# Patient Record
Sex: Female | Born: 1965 | Race: Black or African American | Hispanic: No | Smoking: Former smoker
Health system: Southern US, Community
[De-identification: ages and names within clinical notes are randomized; demographics above are authoritative.]

## PROBLEM LIST (undated history)

## (undated) DIAGNOSIS — I10 Essential (primary) hypertension: Secondary | ICD-10-CM

## (undated) DIAGNOSIS — M329 Systemic lupus erythematosus, unspecified: Secondary | ICD-10-CM

## (undated) DIAGNOSIS — M1990 Unspecified osteoarthritis, unspecified site: Secondary | ICD-10-CM

## (undated) HISTORY — DX: Essential (primary) hypertension: I10

---

## 2009-01-21 ENCOUNTER — Ambulatory Visit: Payer: Self-pay | Admitting: Family Medicine

## 2009-03-11 ENCOUNTER — Ambulatory Visit: Payer: Self-pay | Admitting: Family Medicine

## 2010-08-31 ENCOUNTER — Ambulatory Visit: Payer: Self-pay | Admitting: Family Medicine

## 2011-01-06 ENCOUNTER — Ambulatory Visit: Payer: Self-pay | Admitting: Family Medicine

## 2011-01-16 ENCOUNTER — Ambulatory Visit: Payer: Self-pay | Admitting: Family Medicine

## 2011-02-09 ENCOUNTER — Ambulatory Visit: Payer: Self-pay | Admitting: Family Medicine

## 2011-03-09 ENCOUNTER — Ambulatory Visit: Payer: Self-pay | Admitting: Gastroenterology

## 2011-03-14 LAB — PATHOLOGY REPORT

## 2011-12-21 ENCOUNTER — Emergency Department: Payer: Self-pay | Admitting: Unknown Physician Specialty

## 2011-12-21 LAB — URINALYSIS, COMPLETE
Bacteria: NONE SEEN
Bilirubin,UR: NEGATIVE
Glucose,UR: NEGATIVE mg/dL (ref 0–75)
Ketone: NEGATIVE
Leukocyte Esterase: NEGATIVE
Nitrite: NEGATIVE
Ph: 6 (ref 4.5–8.0)
RBC,UR: 2 /HPF (ref 0–5)
Squamous Epithelial: 2
WBC UR: 2 /HPF (ref 0–5)

## 2011-12-21 LAB — PREGNANCY, URINE: Pregnancy Test, Urine: NEGATIVE m[IU]/mL

## 2012-02-05 IMAGING — US TRANSABDOMINAL ULTRASOUND OF PELVIS
1 series · 17 of 25 positions shown · non-contrast
Comparison: none

REASON FOR EXAM: ovarian cyst
COMMENTS:

[Series 1: transabdominal ultrasound of pelvis · 200 acquisitions, 17 frames shown]
[im 1/200]
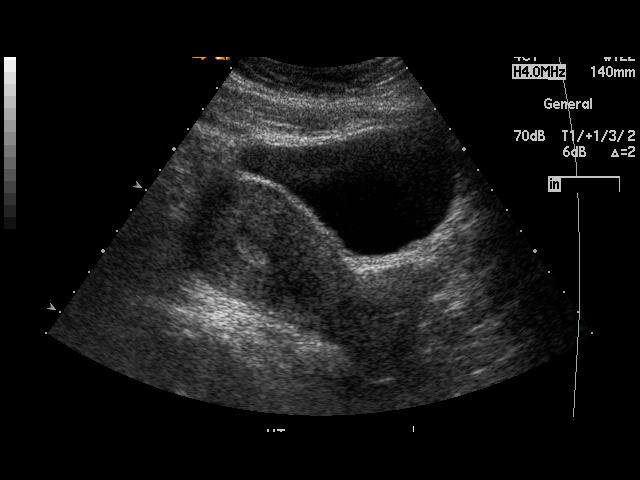
[im 17/200]
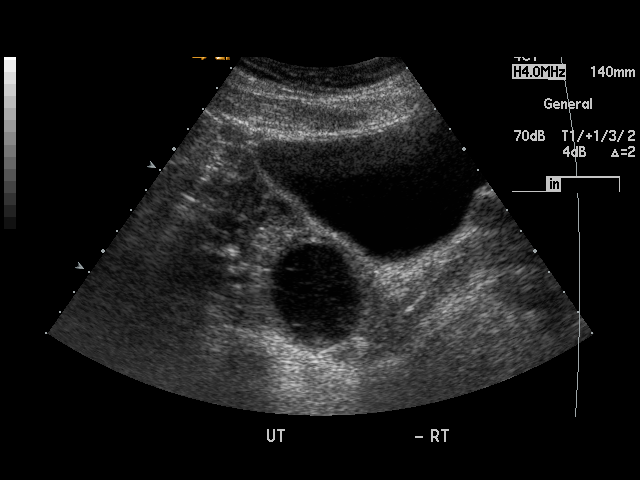
[im 25/200]
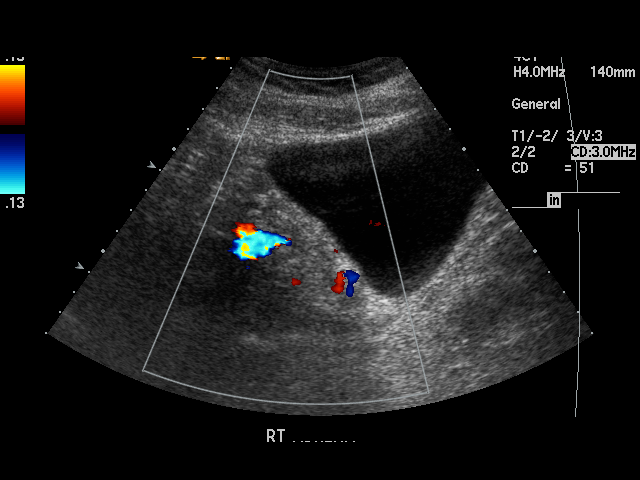
[im 42/200]
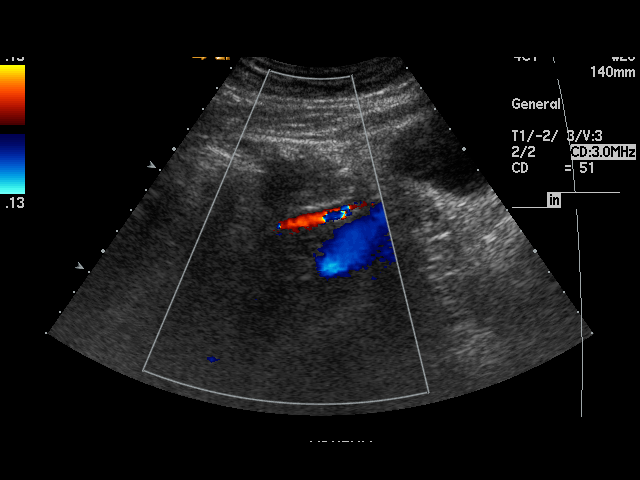
[im 50/200]
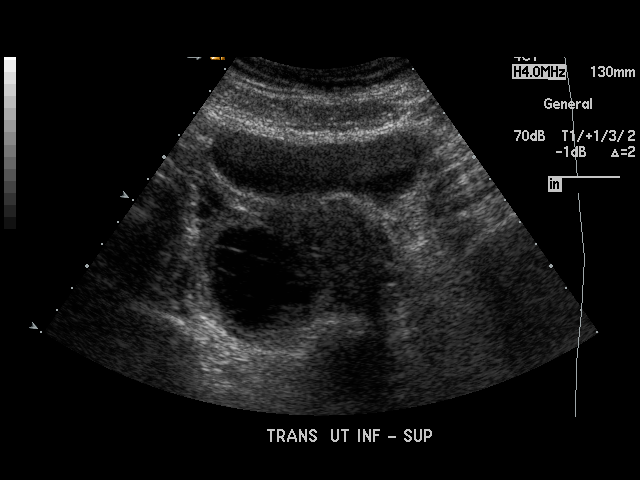
[im 67/200]
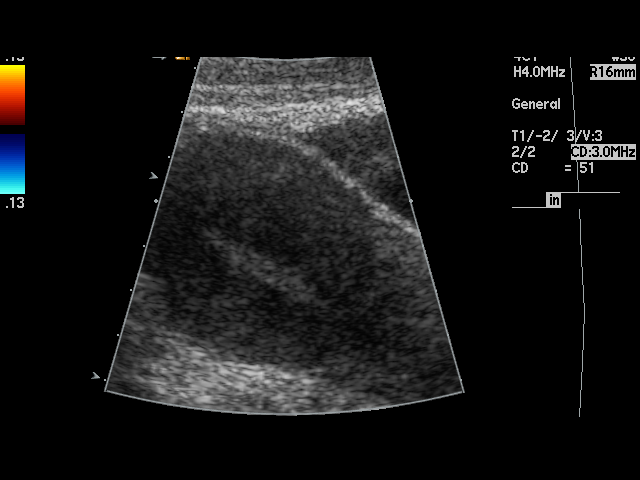
[im 75/200]
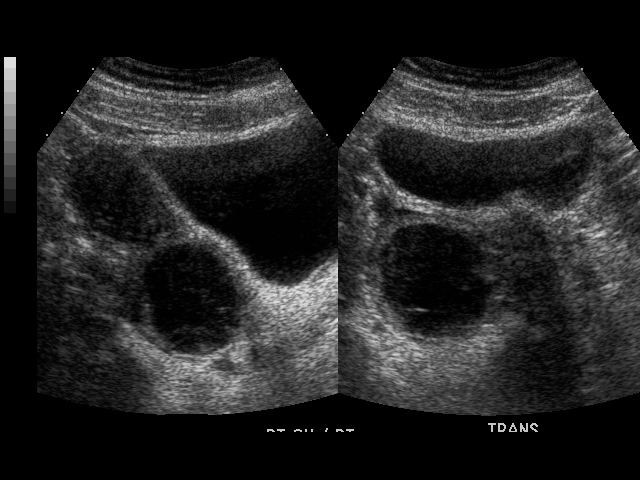
[im 92/200]
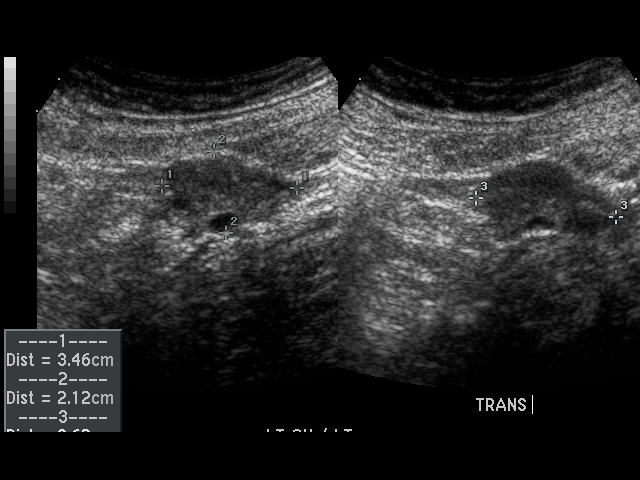
[im 100/200]
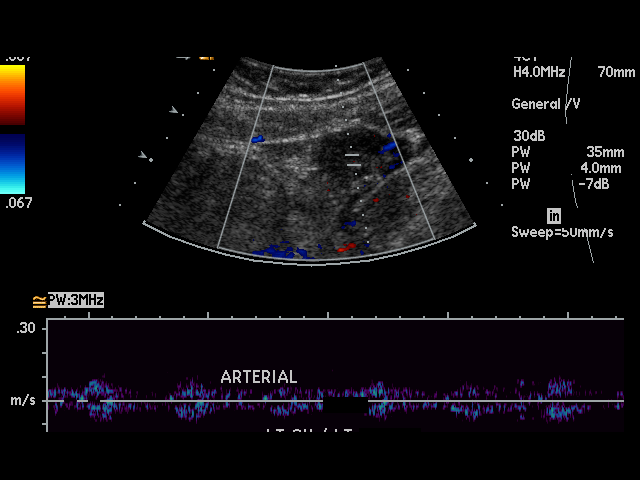
[im 108/200]
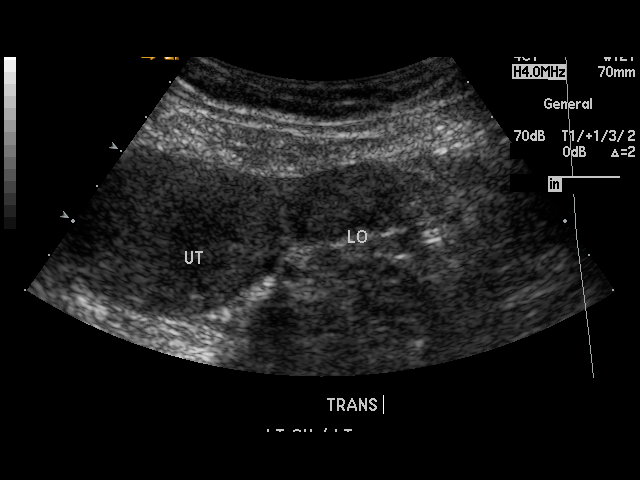
[im 125/200]
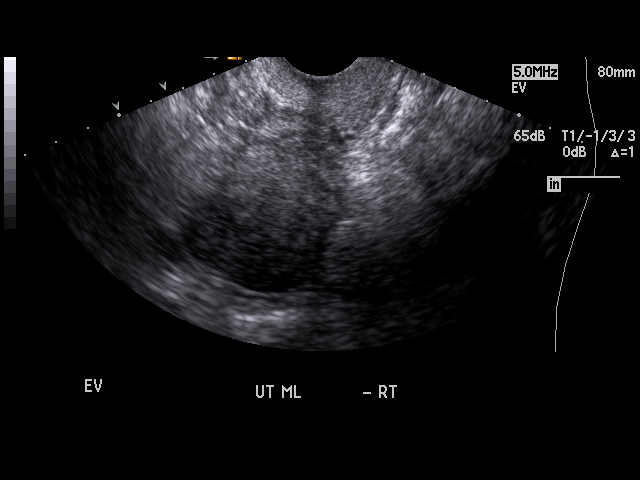
[im 133/200]
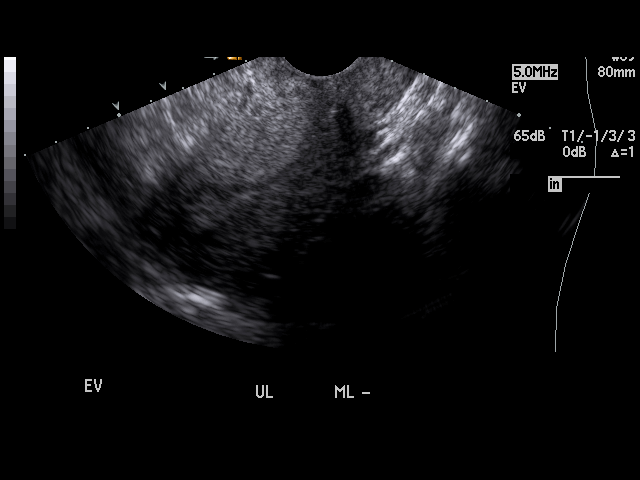
[im 150/200]
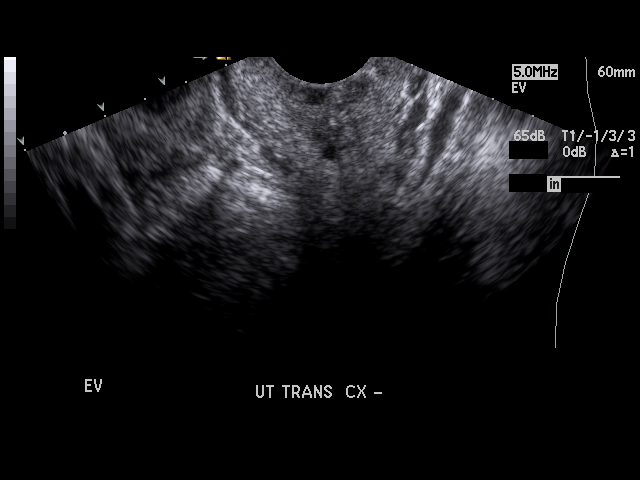
[im 158/200]
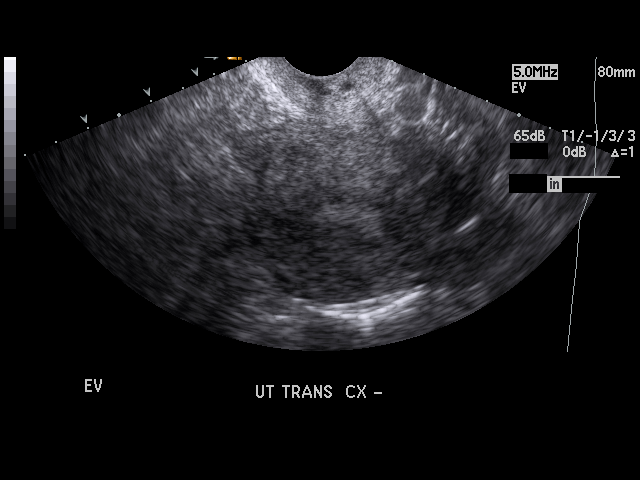
[im 175/200]
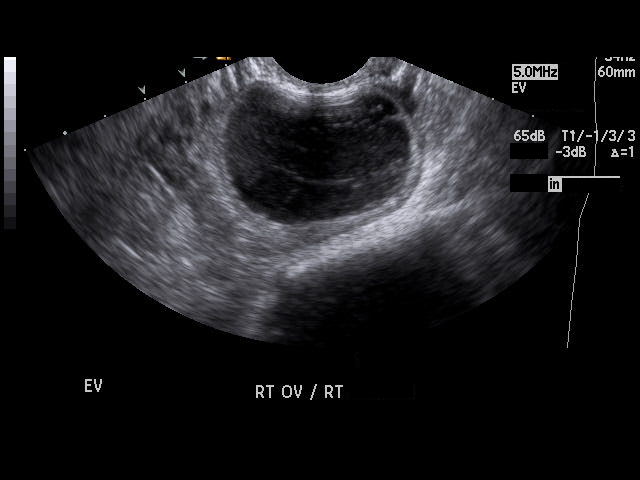
[im 183/200]
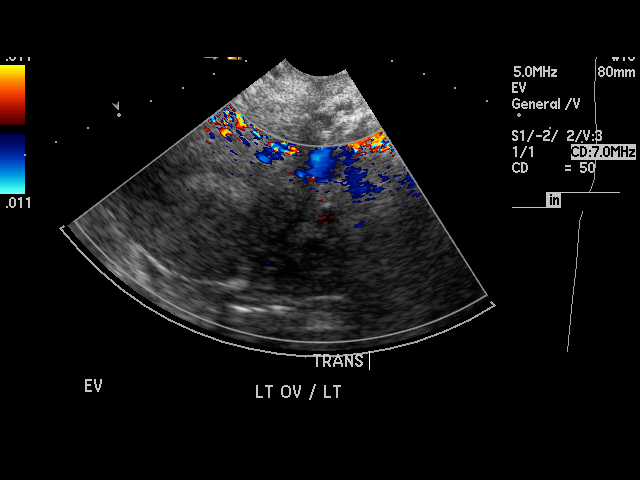
[im 200/200]
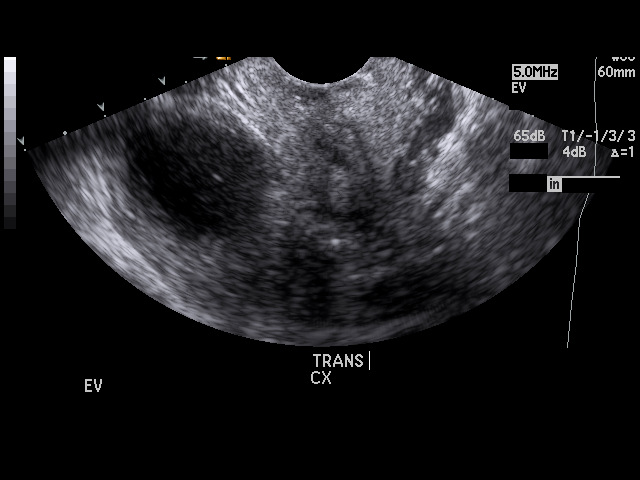

[17 of 25 positions shown; findings below may reference images not displayed]

PROCEDURE:     NIWANERADWOR - NIWANERADWOR PELVIS NON-OB W/TRANSVAGINAL  - February 09, 2011  [DATE]

RESULT:     The uterus measures 10.16 cm x 4.52 cm x 5.32 cm. The
endometrium measures 5.8 mm in thickness. There are a few Nabothian cysts.
Otherwise, no uterine masses are seen. The right and left ovaries are
visualized. The right ovary measures 4.92 cm at maximum diameter and the
left ovary measures 3.62 cm at maximum diameter. There is a 4.23 cm, complex
cyst on the right consistent with a hemorrhagic cyst. The left ovarian cyst,
observed at prior CT, is no longer seen. No free fluid is observed in the
pelvis. The kidneys show no hydronephrosis.
IMPRESSION: 1.  There is a complex mass of the right ovary measuring 4.23 cm at maximum
diameter and consistent with a hemorrhagic cyst.
2.  The left ovarian cyst, observed previously at CT, is no longer evident.
3.  No free fluid is noted in the pelvis.
4.  The uterus is normal in appearance.
5.  Incidental note is made of a few Nabothian cysts.

## 2012-05-08 ENCOUNTER — Ambulatory Visit: Payer: Self-pay | Admitting: Family Medicine

## 2012-06-18 ENCOUNTER — Ambulatory Visit: Payer: Self-pay | Admitting: Family Medicine

## 2013-06-14 IMAGING — MG MM CAD SCREENING MAMMO
1 series · 4 of 4 positions shown · non-contrast
Comparison: none

REASON FOR EXAM: SCR MAMMO BASELINE
COMMENTS:

PROCEDURE:     MAM - MAM DGTL SCREENING MAMMO W/CAD  - June 18, 2012  [DATE]
RESULT:

[R CC · right · 4 of 4 slices shown]
[im 1/4]
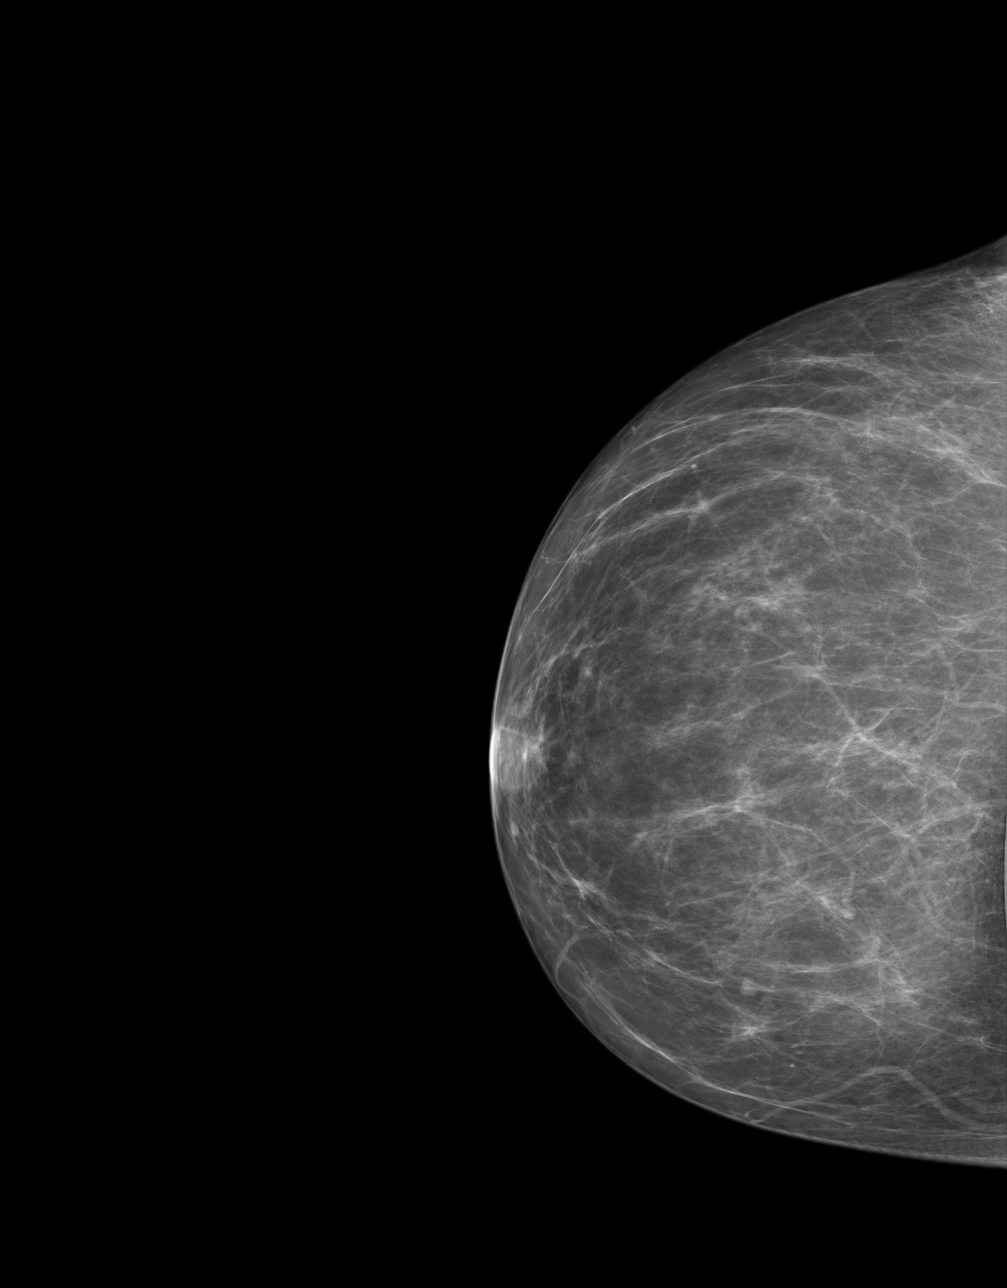
[im 2/4]
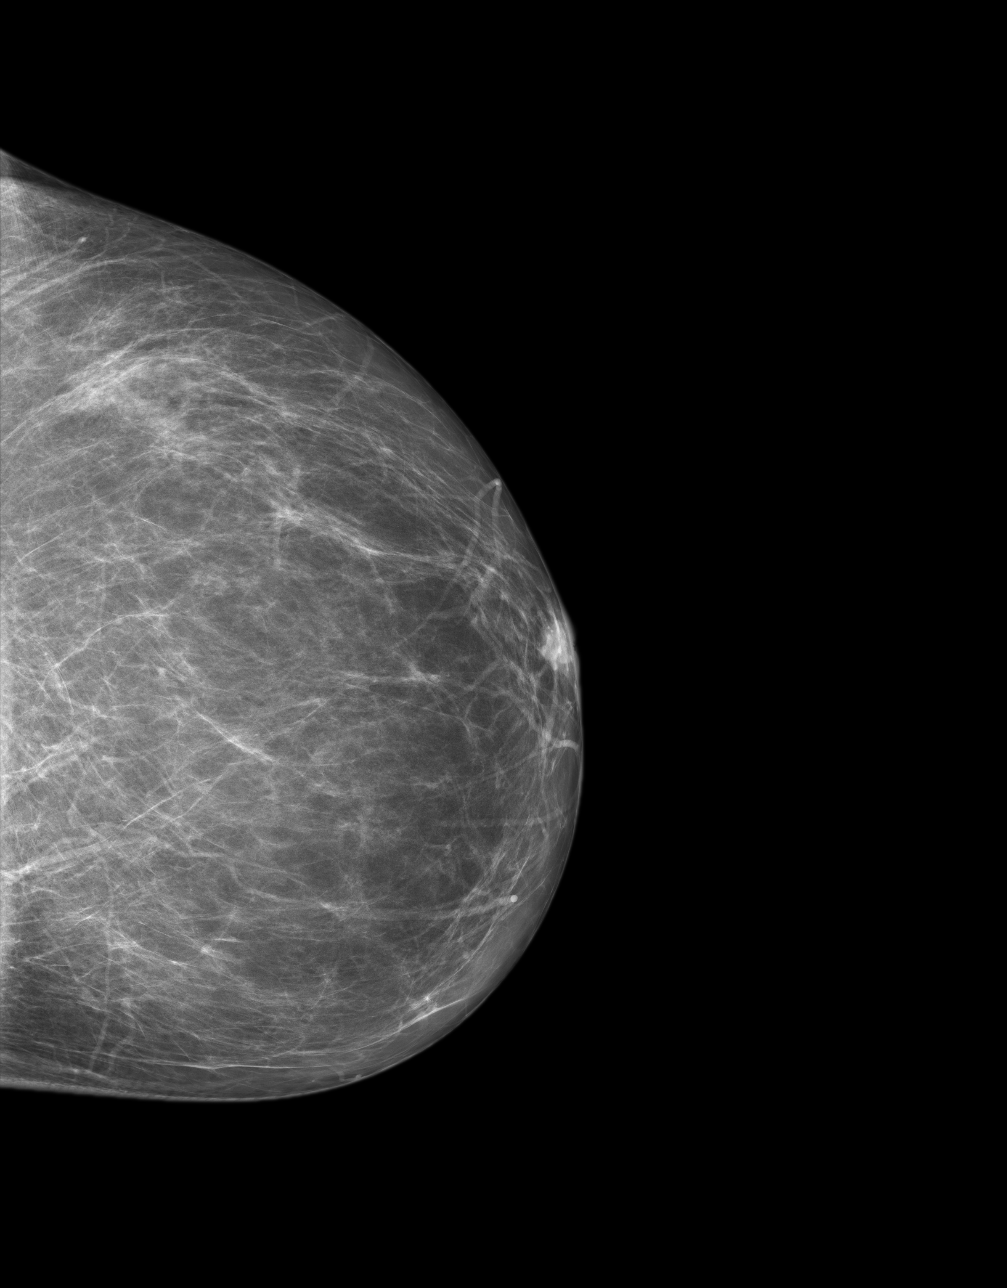
[im 3/4]
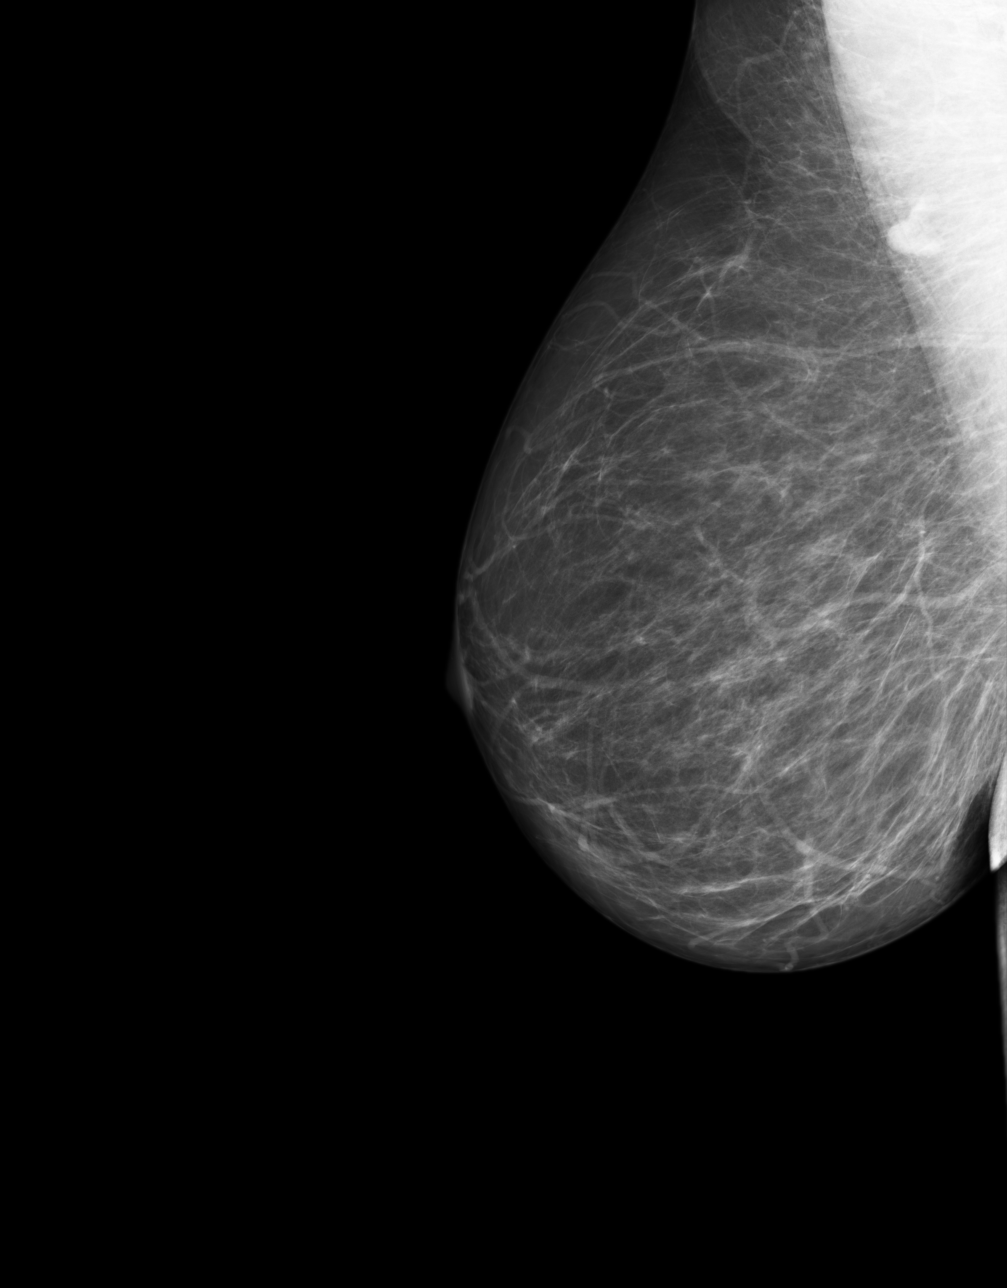
[im 4/4]
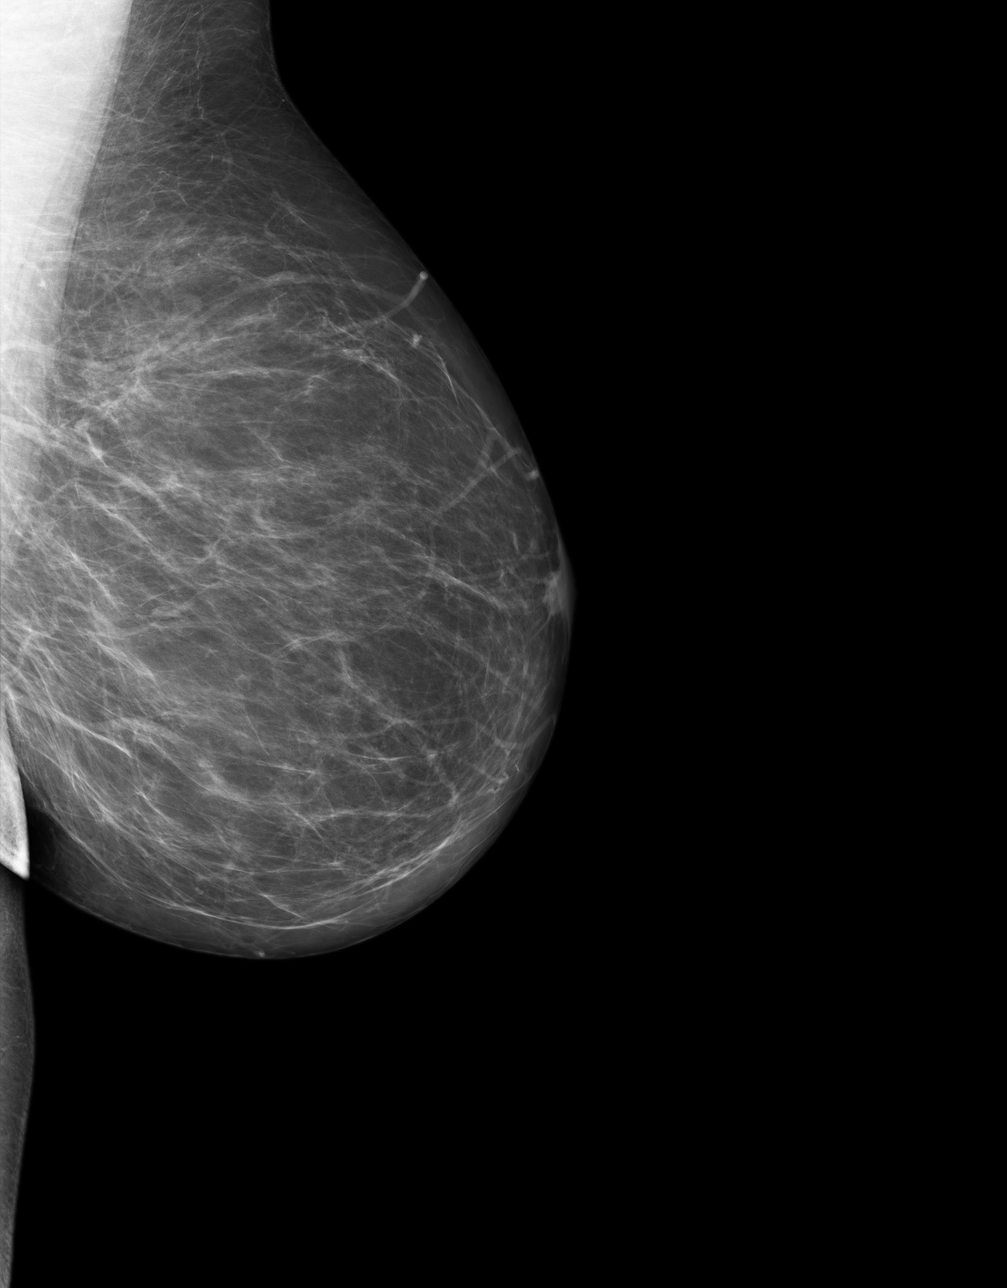

[4 of 4 positions shown; findings below may reference images not displayed]

FINDINGS: There is no personal or family history of breast cancer. The
patient has no history of breast surgery.

Comparison is made to previous digital mammographic images dated 16 July, 2006.

There is a mild parenchymal density with predominant fatty replacement.
There is no dominant mass, developing density or malignant calcification.
There is no architectural distortion. The mammographic appearance is stable.
IMPRESSION: Stable, benign appearing bilateral mammogram.

RECOMMENDATION:  Please continue to encourage annual mammographic follow-up
and monthly breast self exam.

BI-RADS: Category 2 - Benign Findings

Breast density: BI-RADS Type I

[REDACTED]

Thank you for this opportunity to contribute to the care of your patient.

A NEGATIVE MAMMOGRAM REPORT DOES NOT PRECLUDE BIOPSY OR OTHER EVALUATION OF
A CLINICALLY PALPABLE OR OTHERWISE SUSPICIOUS MASS OR LESION. BREAST CANCER
MAY NOT BE DETECTED BY MAMMOGRAPHY IN UP TO 10% OF CASES.

## 2013-09-01 ENCOUNTER — Ambulatory Visit: Payer: Self-pay | Admitting: Family Medicine

## 2013-09-15 ENCOUNTER — Ambulatory Visit: Payer: Self-pay | Admitting: Family Medicine

## 2014-09-13 ENCOUNTER — Emergency Department: Payer: Self-pay | Admitting: Emergency Medicine

## 2014-10-12 ENCOUNTER — Ambulatory Visit
Admit: 2014-10-12 | Disposition: A | Discharge status: Home / Self Care | Payer: Self-pay | Attending: Physician Assistant | Admitting: Physician Assistant

## 2015-01-29 ENCOUNTER — Ambulatory Visit
Admission: RE | Admit: 2015-01-29 | Discharge: 2015-01-29 | Disposition: A | Discharge status: Home / Self Care | Payer: Medicaid Other | Source: Ambulatory Visit | Attending: Physician Assistant | Admitting: Physician Assistant

## 2015-01-29 ENCOUNTER — Other Ambulatory Visit: Payer: Self-pay | Admitting: Physician Assistant

## 2015-01-29 DIAGNOSIS — M25551 Pain in right hip: Secondary | ICD-10-CM | POA: Insufficient documentation

## 2015-01-29 DIAGNOSIS — M545 Low back pain: Secondary | ICD-10-CM | POA: Diagnosis present

## 2015-07-08 ENCOUNTER — Other Ambulatory Visit: Payer: Self-pay | Admitting: Physician Assistant

## 2015-07-08 DIAGNOSIS — Z1231 Encounter for screening mammogram for malignant neoplasm of breast: Secondary | ICD-10-CM

## 2015-07-08 DIAGNOSIS — E079 Disorder of thyroid, unspecified: Secondary | ICD-10-CM

## 2015-07-09 ENCOUNTER — Telehealth (HOSPITAL_COMMUNITY): Payer: Self-pay | Admitting: Physician Assistant

## 2015-07-13 ENCOUNTER — Ambulatory Visit: Payer: Medicaid Other

## 2015-07-20 ENCOUNTER — Ambulatory Visit: Payer: Medicaid Other

## 2015-07-21 ENCOUNTER — Ambulatory Visit: Payer: Medicaid Other

## 2015-07-27 ENCOUNTER — Ambulatory Visit
Admission: RE | Admit: 2015-07-27 | Discharge: 2015-07-27 | Disposition: A | Discharge status: Home / Self Care | Payer: Medicaid Other | Source: Ambulatory Visit | Attending: Physician Assistant | Admitting: Physician Assistant

## 2015-07-27 DIAGNOSIS — E079 Disorder of thyroid, unspecified: Secondary | ICD-10-CM

## 2015-07-27 DIAGNOSIS — Z1231 Encounter for screening mammogram for malignant neoplasm of breast: Secondary | ICD-10-CM | POA: Diagnosis not present

## 2015-07-27 DIAGNOSIS — R591 Generalized enlarged lymph nodes: Secondary | ICD-10-CM | POA: Diagnosis not present

## 2016-06-23 ENCOUNTER — Other Ambulatory Visit: Payer: Self-pay | Admitting: Physician Assistant

## 2016-07-19 ENCOUNTER — Other Ambulatory Visit: Payer: Self-pay | Admitting: Physician Assistant

## 2016-07-19 DIAGNOSIS — Z1239 Encounter for other screening for malignant neoplasm of breast: Secondary | ICD-10-CM

## 2016-08-24 ENCOUNTER — Ambulatory Visit
Admission: RE | Admit: 2016-08-24 | Discharge: 2016-08-24 | Disposition: A | Discharge status: Home / Self Care | Payer: Medicaid Other | Source: Ambulatory Visit | Attending: Physician Assistant | Admitting: Physician Assistant

## 2016-08-24 DIAGNOSIS — Z1239 Encounter for other screening for malignant neoplasm of breast: Secondary | ICD-10-CM

## 2016-08-24 DIAGNOSIS — Z1231 Encounter for screening mammogram for malignant neoplasm of breast: Secondary | ICD-10-CM | POA: Diagnosis present

## 2017-08-29 ENCOUNTER — Other Ambulatory Visit: Payer: Self-pay | Admitting: Physician Assistant

## 2017-08-29 DIAGNOSIS — Z1231 Encounter for screening mammogram for malignant neoplasm of breast: Secondary | ICD-10-CM

## 2017-09-21 ENCOUNTER — Ambulatory Visit
Admission: RE | Admit: 2017-09-21 | Discharge: 2017-09-21 | Disposition: A | Discharge status: Home / Self Care | Payer: Medicaid Other | Source: Ambulatory Visit | Attending: Physician Assistant | Admitting: Physician Assistant

## 2017-09-21 DIAGNOSIS — Z1231 Encounter for screening mammogram for malignant neoplasm of breast: Secondary | ICD-10-CM | POA: Diagnosis not present

## 2017-09-27 ENCOUNTER — Other Ambulatory Visit: Payer: Self-pay | Admitting: Physician Assistant

## 2017-09-27 DIAGNOSIS — R928 Other abnormal and inconclusive findings on diagnostic imaging of breast: Secondary | ICD-10-CM

## 2017-09-27 DIAGNOSIS — N6489 Other specified disorders of breast: Secondary | ICD-10-CM

## 2017-10-05 ENCOUNTER — Ambulatory Visit
Admission: RE | Admit: 2017-10-05 | Discharge: 2017-10-05 | Disposition: A | Discharge status: Home / Self Care | Payer: Medicaid Other | Source: Ambulatory Visit | Attending: Physician Assistant | Admitting: Physician Assistant

## 2017-10-05 DIAGNOSIS — N6489 Other specified disorders of breast: Secondary | ICD-10-CM | POA: Insufficient documentation

## 2017-10-05 DIAGNOSIS — R928 Other abnormal and inconclusive findings on diagnostic imaging of breast: Secondary | ICD-10-CM

## 2017-11-26 IMAGING — US US SOFT TISSUE HEAD/NECK
1 series · 14 of 25 positions shown · non-contrast
Comparison: None.

CLINICAL DATA: 50-year-old female with thyroid abnormality on
physical exam

EXAM:
THYROID ULTRASOUND
TECHNIQUE: Ultrasound examination of the thyroid gland and adjacent soft
tissues was performed.

[Series 1: us soft tissue head/neck · 0.07mm/px · 14 of 49 slices shown]
[im 1/49]
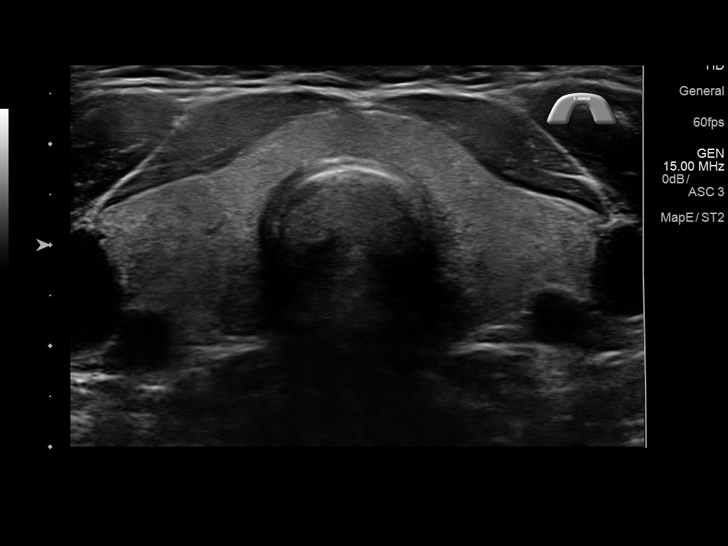
[im 5/49]
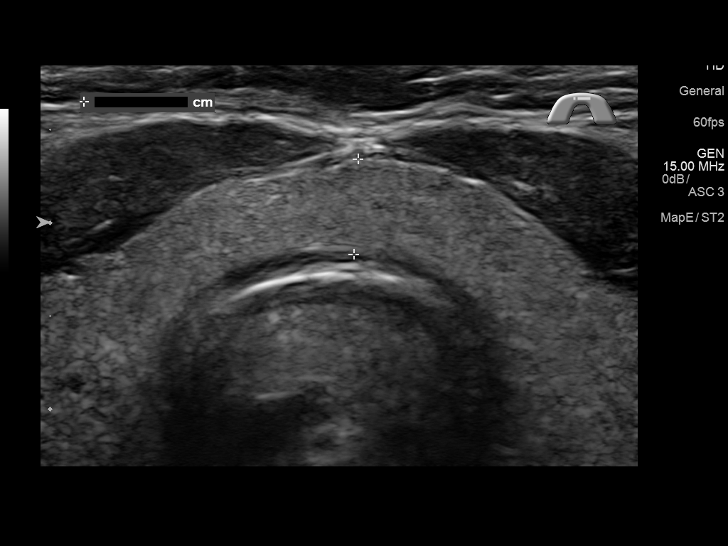
[im 9/49]
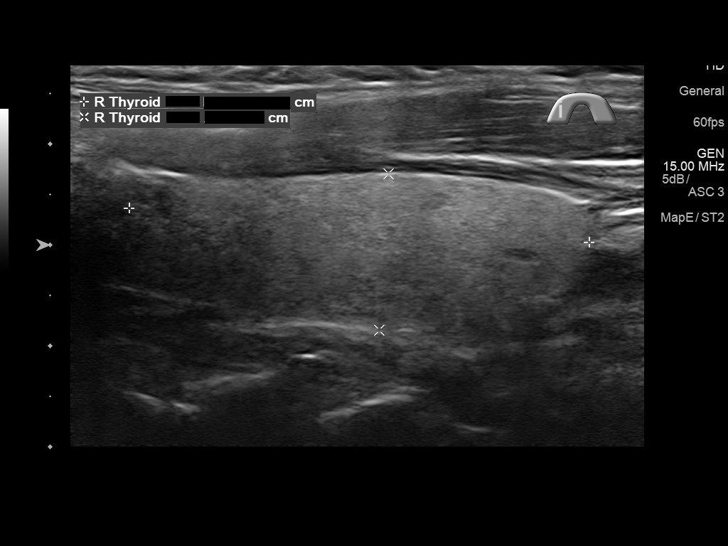
[im 13/49]
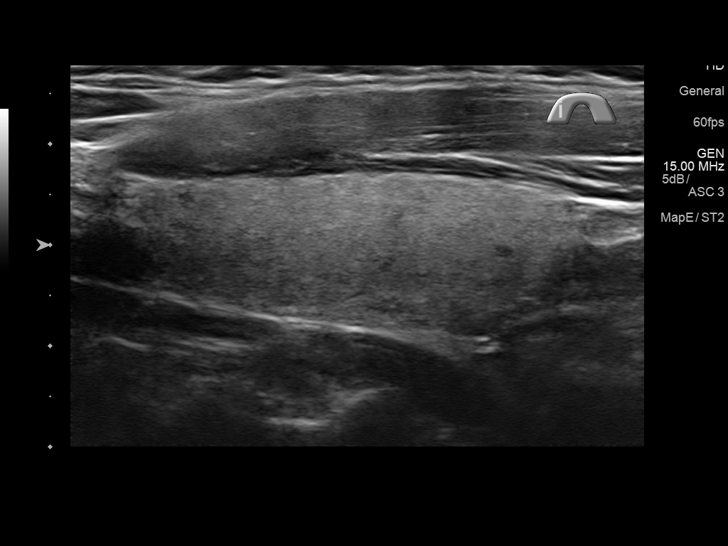
[im 17/49]
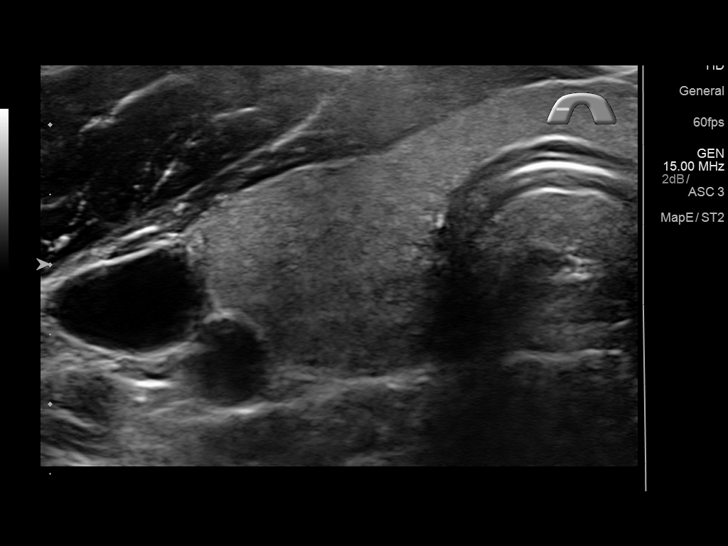
[im 19/49]
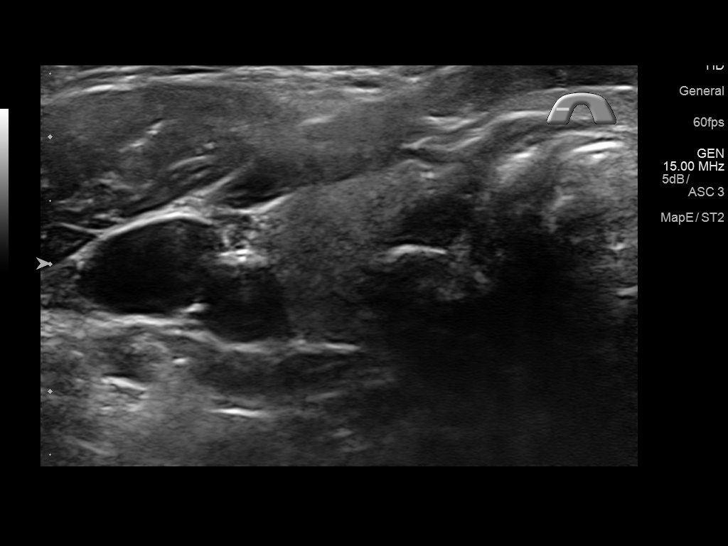
[im 23/49]
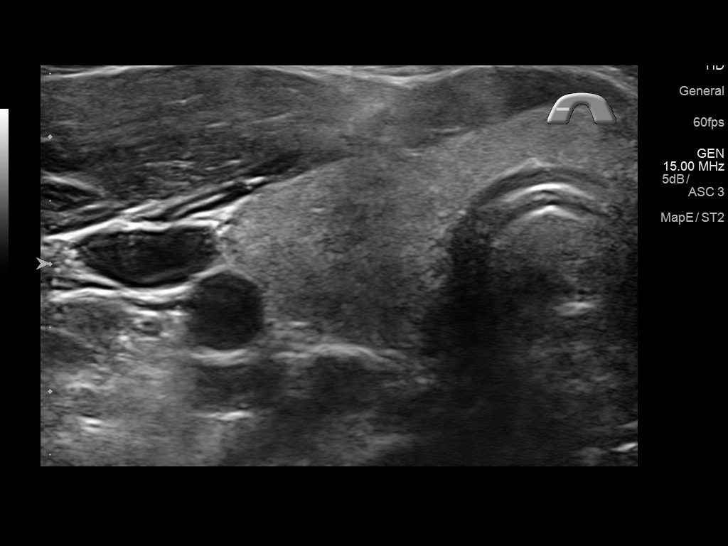
[im 27/49]
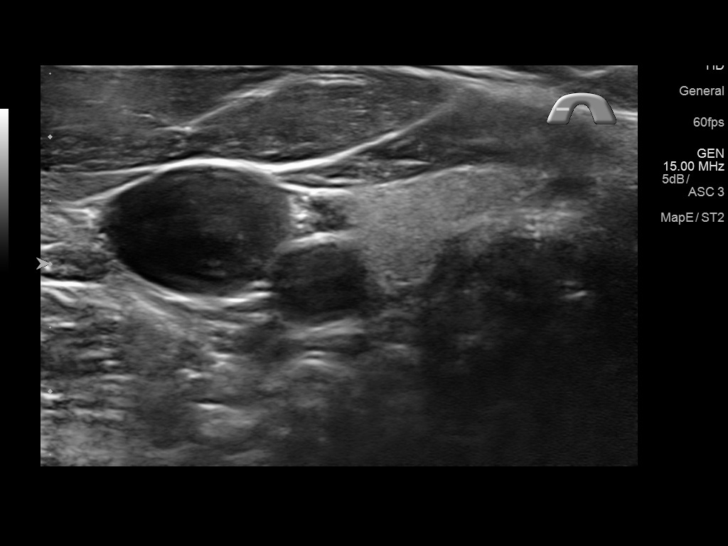
[im 31/49]
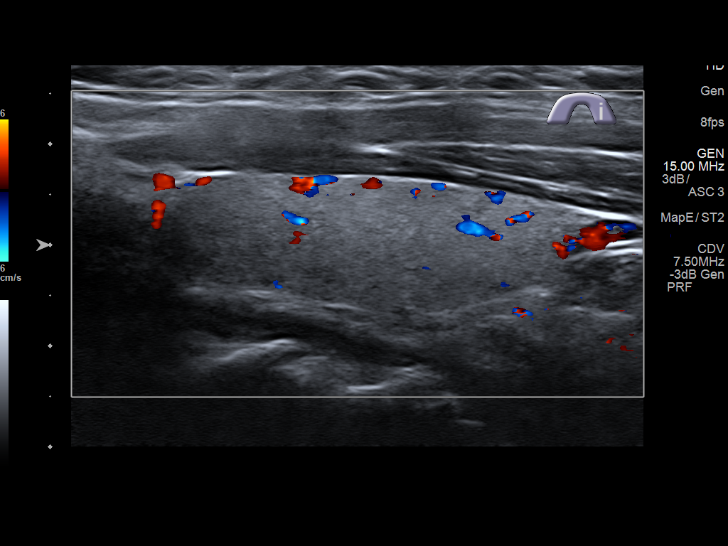
[im 33/49]
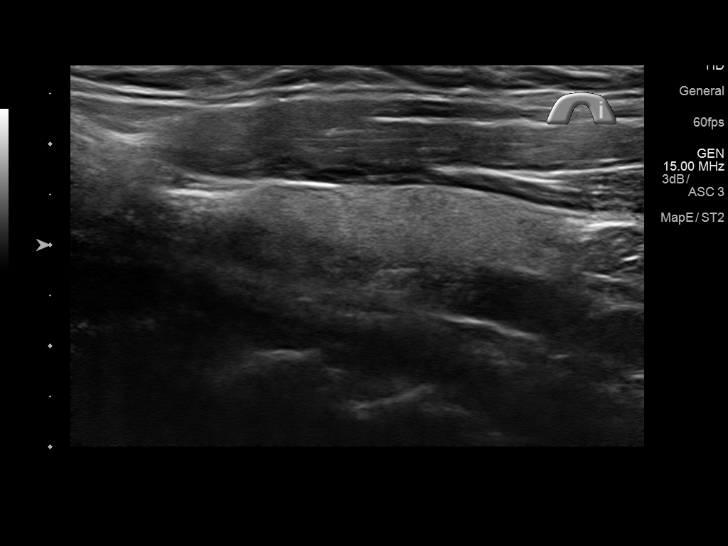
[im 37/49]
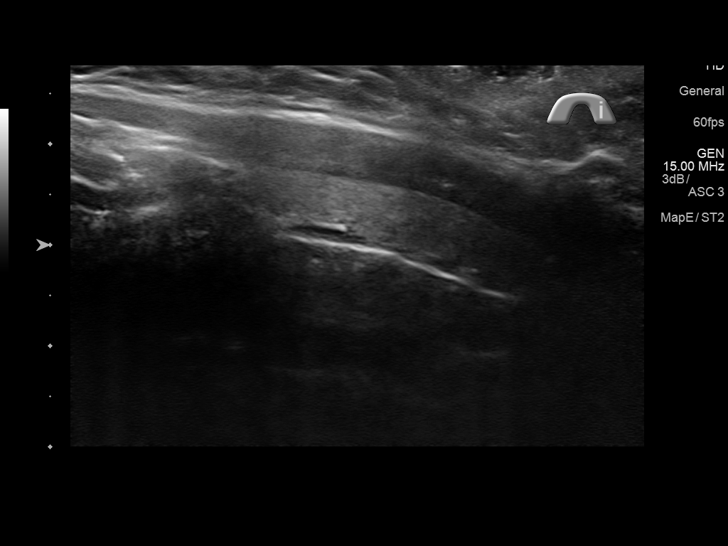
[im 41/49]
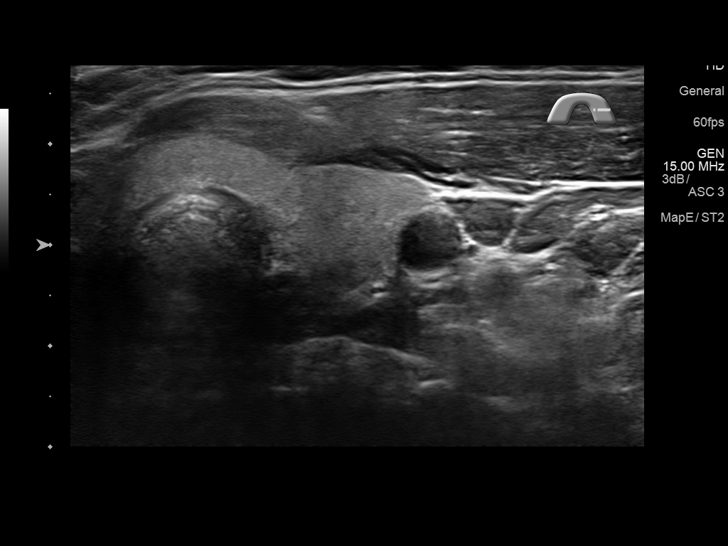
[im 45/49]
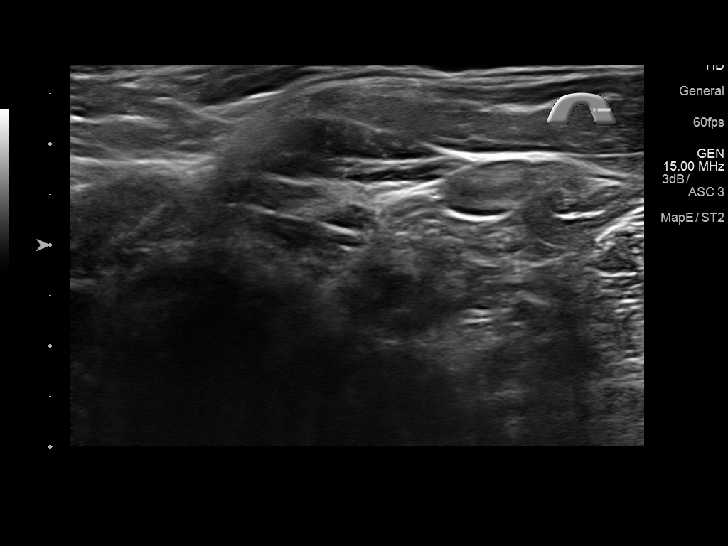
[im 49/49]
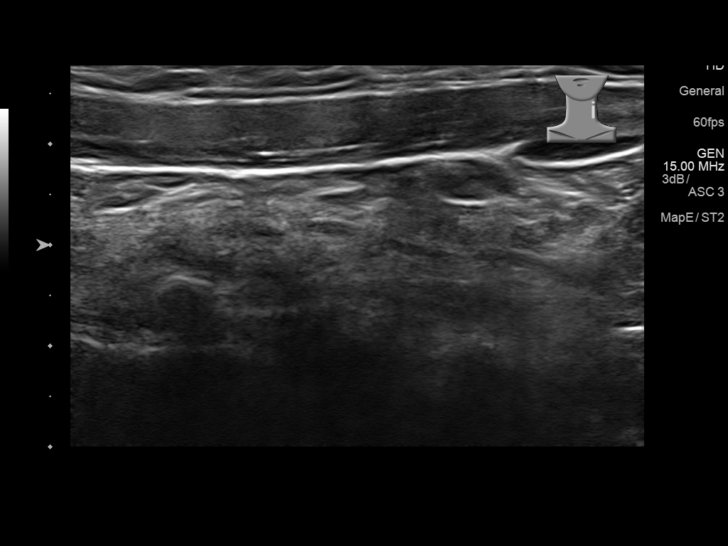

[14 of 25 positions shown; findings below may reference images not displayed]

FINDINGS: Right thyroid lobe

Measurements: 4.6 x 1.6 x 1.8 cm.  No nodules visualized.

Left thyroid lobe

Measurements: 4.4 x 1.3 x 1.6 cm.  No nodules visualized.

Isthmus

Thickness: 0.5 cm.  No nodules visualized.

Lymphadenopathy

None visualized.
IMPRESSION: Normal sonographic appearance of the thyroid gland.

No nodules identified.

## 2017-12-24 ENCOUNTER — Other Ambulatory Visit: Payer: Self-pay | Admitting: Physician Assistant

## 2017-12-24 ENCOUNTER — Ambulatory Visit
Admission: RE | Admit: 2017-12-24 | Discharge: 2017-12-24 | Disposition: A | Discharge status: Home / Self Care | Payer: Medicaid Other | Source: Ambulatory Visit | Attending: Physician Assistant | Admitting: Physician Assistant

## 2017-12-24 DIAGNOSIS — R05 Cough: Secondary | ICD-10-CM | POA: Insufficient documentation

## 2017-12-24 DIAGNOSIS — R059 Cough, unspecified: Secondary | ICD-10-CM

## 2018-10-02 ENCOUNTER — Telehealth: Payer: Self-pay | Admitting: Obstetrics & Gynecology

## 2018-10-02 NOTE — Telephone Encounter (Signed)
Erica Nixon referring for Screening Cervical malignancy. Called and left voicemail for patient to call back to be schedule

## 2018-11-25 ENCOUNTER — Encounter: Payer: Medicaid Other | Admitting: Obstetrics & Gynecology

## 2018-12-06 ENCOUNTER — Telehealth: Payer: Self-pay

## 2018-12-06 DIAGNOSIS — Z20822 Contact with and (suspected) exposure to covid-19: Secondary | ICD-10-CM

## 2018-12-06 NOTE — Telephone Encounter (Signed)
Incoming call from Marya Fossa of [facility] Dept.  Referring Patient for Covid- 19 Testing.  Telephone call to Patient to schedule  Pt.  For appoint. On Monday 12/09/18.  Patient voices understanding.

## 2018-12-09 ENCOUNTER — Other Ambulatory Visit: Payer: Self-pay | Admitting: Hematology

## 2018-12-09 ENCOUNTER — Other Ambulatory Visit: Payer: Medicaid Other

## 2018-12-09 DIAGNOSIS — Z20822 Contact with and (suspected) exposure to covid-19: Secondary | ICD-10-CM

## 2018-12-09 NOTE — Progress Notes (Signed)
Re-ordered COVID screening. 

## 2018-12-26 ENCOUNTER — Ambulatory Visit (INDEPENDENT_AMBULATORY_CARE_PROVIDER_SITE_OTHER): Payer: Medicaid Other | Admitting: Obstetrics & Gynecology

## 2018-12-26 ENCOUNTER — Other Ambulatory Visit (HOSPITAL_COMMUNITY)
Admission: RE | Admit: 2018-12-26 | Discharge: 2018-12-26 | Disposition: A | Discharge status: Home / Self Care | Payer: Medicaid Other | Source: Ambulatory Visit | Attending: Obstetrics & Gynecology | Admitting: Obstetrics & Gynecology

## 2018-12-26 ENCOUNTER — Encounter: Payer: Self-pay | Admitting: Obstetrics & Gynecology

## 2018-12-26 ENCOUNTER — Other Ambulatory Visit: Payer: Self-pay

## 2018-12-26 VITALS — BP 122/80 | Ht 64.0 in | Wt 197.0 lb

## 2018-12-26 DIAGNOSIS — R1030 Lower abdominal pain, unspecified: Secondary | ICD-10-CM | POA: Diagnosis not present

## 2018-12-26 DIAGNOSIS — Z87410 Personal history of cervical dysplasia: Secondary | ICD-10-CM

## 2018-12-26 NOTE — Patient Instructions (Signed)
Pap Test  Why am I having this test?  A Pap test, also called a Pap smear, is a screening test to check for signs of:  · Cancer of the vagina, cervix, and uterus. The cervix is the lower part of the uterus that opens into the vagina.  · Infection.  · Changes that may be a sign that cancer is developing (precancerous changes).  Women need this test on a regular basis. In general, you should have a Pap test every 3 years until you reach menopause or age 53. Women aged 30-60 may choose to have their Pap test done at the same time as an HPV (human papillomavirus) test every 5 years (instead of every 3 years).  Your health care provider may recommend having Pap tests more or less often depending on your medical conditions and past Pap test results.  What kind of sample is taken?    Your health care provider will collect a sample of cells from the surface of your cervix. This will be done using a small cotton swab, plastic spatula, or brush. This sample is often collected during a pelvic exam, when you are lying on your back on an exam table with feet in footrests (stirrups).  In some cases, fluids (secretions) from the cervix or vagina may also be collected.  How do I prepare for this test?  · Be aware of where you are in your menstrual cycle. If you are menstruating on the day of the test, you may be asked to reschedule.  · You may need to reschedule if you have a known vaginal infection on the day of the test.  · Follow instructions from your health care provider about:  ? Changing or stopping your regular medicines. Some medicines can cause abnormal test results, such as digitalis and tetracycline.  ? Avoiding douching or taking a bath the day before or the day of the test.  Tell a health care provider about:  · Any allergies you have.  · All medicines you are taking, including vitamins, herbs, eye drops, creams, and over-the-counter medicines.  · Any blood disorders you have.  · Any surgeries you have had.  · Any  medical conditions you have.  · Whether you are pregnant or may be pregnant.  How are the results reported?  Your test results will be reported as either abnormal or normal.  A false-positive result can occur. A false positive is incorrect because it means that a condition is present when it is not.  A false-negative result can occur. A false negative is incorrect because it means that a condition is not present when it is.  What do the results mean?  A normal test result means that you do not have signs of cancer of the vagina, cervix, or uterus.  An abnormal result may mean that you have:  · Cancer. A Pap test by itself is not enough to diagnose cancer. You will have more tests done in this case.  · Precancerous changes in your vagina, cervix, or uterus.  · Inflammation of the cervix.  · An STD (sexually transmitted disease).  · A fungal infection.  · A parasite infection.  Talk with your health care provider about what your results mean.  Questions to ask your health care provider  Ask your health care provider, or the department that is doing the test:  · When will my results be ready?  · How will I get my results?  · What are my   treatment options?  · What other tests do I need?  · What are my next steps?  Summary  · In general, women should have a Pap test every 3 years until they reach menopause or age 53.  · Your health care provider will collect a sample of cells from the surface of your cervix. This will be done using a small cotton swab, plastic spatula, or brush.  · In some cases, fluids (secretions) from the cervix or vagina may also be collected.  This information is not intended to replace advice given to you by your health care provider. Make sure you discuss any questions you have with your health care provider.  Document Released: 09/16/2002 Document Revised: 03/05/2017 Document Reviewed: 03/05/2017  Elsevier Interactive Patient Education © 2019 Elsevier Inc.

## 2018-12-26 NOTE — Progress Notes (Signed)
Consultant- [facility], Kerri Perches, [place] Consulting Reason- Prior Cervical Dysplasia CC: Also reports lower abdominal Pain  Patient is a 53 yo G37P4 AA F who still has reg cycles (28 d/5d/medium flow) who presents for evaluation of abdominal pain as well as in need of PAP due to prior distant h/o LGSIL; has not had a PAP in several years.   The pain is described as aching and cramping, and is 5/10 in intensity. Pain is located in the suprapubic area without radiation. Onset was intermittent occurring a few weeks ago. Symptoms have been unchanged since. Aggravating factors: none. Alleviating factors: none. Associated symptoms: none. The patient denies constipation, diarrhea, dysuria, fever, frequency, nausea and vomiting. Risk factors for pelvic/abdominal pain include none.  Rare hot flash.  No reported concerns w her periods.   PMHx: She  has a past medical history of Hypertension. Also,  has no past surgical history on file., family history includes Hypertension in her mother and sister.,  reports that she has been smoking. She has never used smokeless tobacco. She reports that she does not drink alcohol or use drugs.  She has a current medication list which includes the following prescription(s): hydroxychloroquine, amlodipine, cetirizine, citalopram, cyclobenzaprine, losartan, and trazodone. Also, has No Known Allergies.  Review of Systems  Constitutional: Negative for chills, fever and malaise/fatigue.  HENT: Negative for congestion, sinus pain and sore throat.   Eyes: Negative for blurred vision and pain.  Respiratory: Negative for cough and wheezing.   Cardiovascular: Negative for chest pain and leg swelling.  Gastrointestinal: Negative for abdominal pain, constipation, diarrhea, heartburn, nausea and vomiting.  Genitourinary: Negative for dysuria, frequency, hematuria and urgency.  Musculoskeletal: Negative for back pain, joint pain, myalgias and neck pain.  Skin: Negative for  itching and rash.  Neurological: Negative for dizziness, tremors and weakness.  Endo/Heme/Allergies: Does not bruise/bleed easily.  Psychiatric/Behavioral: Negative for depression. The patient is not nervous/anxious and does not have insomnia.     Objective: BP 122/80   Ht 5\' 4"  (1.626 m)   Wt 197 lb (89.4 kg)   LMP 12/19/2018   BMI 33.81 kg/m  Physical Exam Constitutional:      General: She is not in acute distress.    Appearance: She is well-developed.  Genitourinary:     Pelvic exam was performed with patient supine.     Vagina and uterus normal.     No vaginal erythema or bleeding.     No cervical motion tenderness, discharge, polyp or nabothian cyst.     Uterus is mobile.     Uterus is not enlarged.     No uterine mass detected.    Uterus is midaxial.     No right or left adnexal mass present.     Right adnexa not tender.     Left adnexa not tender.     Genitourinary Comments: No fibroid or mass  HENT:     Head: Normocephalic and atraumatic.     Nose: Nose normal.  Abdominal:     General: Bowel sounds are normal. There is no distension.     Palpations: Abdomen is soft.     Tenderness: There is no abdominal tenderness. There is no guarding or rebound. Negative signs include Murphy's sign, Rovsing's sign and McBurney's sign.     Hernia: No hernia is present.  Musculoskeletal: Normal range of motion.  Neurological:     Mental Status: She is alert and oriented to person, place, and time.  Cranial Nerves: No cranial nerve deficit.  Skin:    General: Skin is warm and dry.     ASSESSMENT/PLAN:   Problem List Items Addressed This Visit    History of cervical dysplasia - Primary   Relevant Orders   Cytology - PAP F/u 6 months    Lower abdominal pain        Consider US if pain persists, although reassuring exam today for fibroids, mass, prolapse exclusion       Tylenol, heat, rest         Annamarie MajorPaul , MD, Merlinda FrederickFACOG [facility], [facility]  12/26/2018  10:32 AM

## 2018-12-27 LAB — CYTOLOGY - PAP: Diagnosis: NEGATIVE

## 2018-12-30 ENCOUNTER — Other Ambulatory Visit: Payer: Self-pay | Admitting: Obstetrics & Gynecology

## 2019-05-06 ENCOUNTER — Other Ambulatory Visit: Payer: Self-pay | Admitting: *Deleted

## 2019-05-06 DIAGNOSIS — Z20822 Contact with and (suspected) exposure to covid-19: Secondary | ICD-10-CM

## 2019-05-08 LAB — NOVEL CORONAVIRUS, NAA: SARS-CoV-2, NAA: NOT DETECTED

## 2019-05-31 ENCOUNTER — Ambulatory Visit
Admission: RE | Admit: 2019-05-31 | Discharge: 2019-05-31 | Disposition: A | Discharge status: Home / Self Care | Payer: Medicaid Other | Source: Ambulatory Visit | Attending: Physician Assistant | Admitting: Physician Assistant

## 2019-05-31 ENCOUNTER — Other Ambulatory Visit: Payer: Self-pay | Admitting: Physician Assistant

## 2019-05-31 DIAGNOSIS — R05 Cough: Secondary | ICD-10-CM | POA: Insufficient documentation

## 2019-05-31 DIAGNOSIS — R059 Cough, unspecified: Secondary | ICD-10-CM

## 2019-06-23 ENCOUNTER — Telehealth: Payer: Self-pay | Admitting: Hematology

## 2019-06-23 NOTE — Telephone Encounter (Signed)
Pt is aware covid 19 test in oct was neg. Pt is aware on 06-23-2019

## 2019-09-27 ENCOUNTER — Ambulatory Visit: Payer: Medicaid Other | Attending: Internal Medicine

## 2019-09-27 DIAGNOSIS — Z23 Encounter for immunization: Secondary | ICD-10-CM

## 2019-09-27 NOTE — Progress Notes (Signed)
   Covid-19 Vaccination Clinic  Name:  Felicite Zeimet    MRN: [id number] DOB: 06/27/1966  09/27/2019  Ms. Donofrio was observed post Covid-19 immunization for 15 minutes without incident. She was provided with Vaccine Information Sheet and instruction to access the V-Safe system.   Ms. Buchbinder was instructed to call 911 with any severe reactions post vaccine: Marland Kitchen Difficulty breathing  . Swelling of face and throat  . A fast heartbeat  . A bad rash all over body  . Dizziness and weakness   Immunizations Administered    Name Date Dose VIS Date Route   Pfizer COVID-19 Vaccine 09/27/2019  8:23 AM 0.3 mL 06/20/2019 Intramuscular   Manufacturer: [employer], [employer]   Lot: [id number]   NDC: [address]-3

## 2019-10-18 ENCOUNTER — Ambulatory Visit: Payer: Medicaid Other

## 2019-10-25 ENCOUNTER — Ambulatory Visit: Payer: Medicaid Other | Attending: Internal Medicine

## 2019-10-25 DIAGNOSIS — Z23 Encounter for immunization: Secondary | ICD-10-CM

## 2019-10-25 NOTE — Progress Notes (Signed)
   Covid-19 Vaccination Clinic  Name:  Erica Nixon    MRN: [id number] DOB: 03-20-1966  10/25/2019  [name] was observed post Covid-19 immunization for 15 minutes without incident. She was provided with Vaccine Information Sheet and instruction to access the V-Safe system.   [name] was instructed to call 911 with any severe reactions post vaccine: Marland Kitchen Difficulty breathing  . Swelling of face and throat  . A fast heartbeat  . A bad rash all over body  . Dizziness and weakness   Immunizations Administered    Name Date Dose VIS Date Route   Pfizer COVID-19 Vaccine 10/25/2019  9:00 AM 0.3 mL 06/20/2019 Intramuscular   Manufacturer: [employer], [employer]   Lot: [id number]   NDC: [address]-7

## 2020-01-06 ENCOUNTER — Emergency Department
Admission: EM | Admit: 2020-01-06 | Discharge: 2020-01-06 | Disposition: A | Discharge status: Home / Self Care | Payer: Medicaid Other | Attending: Emergency Medicine | Admitting: Emergency Medicine

## 2020-01-06 ENCOUNTER — Encounter: Payer: Self-pay | Admitting: Emergency Medicine

## 2020-01-06 ENCOUNTER — Emergency Department: Payer: Medicaid Other

## 2020-01-06 ENCOUNTER — Other Ambulatory Visit: Payer: Self-pay

## 2020-01-06 DIAGNOSIS — I10 Essential (primary) hypertension: Secondary | ICD-10-CM | POA: Diagnosis not present

## 2020-01-06 DIAGNOSIS — R197 Diarrhea, unspecified: Secondary | ICD-10-CM | POA: Diagnosis not present

## 2020-01-06 DIAGNOSIS — Z79899 Other long term (current) drug therapy: Secondary | ICD-10-CM | POA: Insufficient documentation

## 2020-01-06 DIAGNOSIS — J029 Acute pharyngitis, unspecified: Secondary | ICD-10-CM | POA: Diagnosis present

## 2020-01-06 DIAGNOSIS — J069 Acute upper respiratory infection, unspecified: Secondary | ICD-10-CM | POA: Diagnosis not present

## 2020-01-06 DIAGNOSIS — F17210 Nicotine dependence, cigarettes, uncomplicated: Secondary | ICD-10-CM | POA: Diagnosis not present

## 2020-01-06 DIAGNOSIS — R059 Cough, unspecified: Secondary | ICD-10-CM

## 2020-01-06 DIAGNOSIS — Z20822 Contact with and (suspected) exposure to covid-19: Secondary | ICD-10-CM | POA: Diagnosis not present

## 2020-01-06 DIAGNOSIS — R05 Cough: Secondary | ICD-10-CM | POA: Diagnosis not present

## 2020-01-06 LAB — GROUP A STREP BY PCR: Group A Strep by PCR: NOT DETECTED

## 2020-01-06 LAB — SARS CORONAVIRUS 2 (TAT 6-24 HRS): SARS Coronavirus 2: NEGATIVE

## 2020-01-06 MED ORDER — ACETAMINOPHEN-CODEINE 120-12 MG/5ML PO SOLN
5.0000 mL | Freq: Once | ORAL | Status: AC
  Administered 2020-01-06: 5 mL via ORAL
  Filled 2020-01-06: qty 5

## 2020-01-06 MED ORDER — GUAIFENESIN-CODEINE 100-10 MG/5ML PO SOLN: 5.0000 mL | Freq: Three times a day (TID) | ORAL | 0 refills | Status: AC | PRN

## 2020-01-06 MED ORDER — LIDOCAINE HCL (PF) 1 % IJ SOLN
INTRAMUSCULAR | Status: AC
  Administered 2020-01-06: 5 mL
  Filled 2020-01-06: qty 5

## 2020-01-06 MED ORDER — CEFTRIAXONE SODIUM 1 G IJ SOLR
1.0000 g | Freq: Once | INTRAMUSCULAR | Status: AC
  Administered 2020-01-06: 1 g via INTRAMUSCULAR
  Filled 2020-01-06: qty 10

## 2020-01-06 MED ORDER — AZITHROMYCIN 250 MG PO TABS
ORAL_TABLET | ORAL | 0 refills | Status: AC
Start: 2020-01-06 — End: ?

## 2020-01-06 MED ORDER — LIDOCAINE HCL (PF) 1 % IJ SOLN: 5.0000 mL | Freq: Once | INTRAMUSCULAR | Status: AC

## 2020-01-06 NOTE — Discharge Instructions (Signed)
Your strep test is negative.  Your chest x-ray looks like you did not take a deep breath when they took the x-ray so I am going to cover you for a possible pneumonia.  Please begin the azithromycin tonight.  You were given a dose of Rocephin in the emergency department.  Please return to the emergency department for any worsening of symptoms.

## 2020-01-06 NOTE — ED Triage Notes (Signed)
Pt reports upper resp symptoms since Sunday. Pt c/o chills, congestion and cough and sore throat.

## 2020-01-06 NOTE — ED Provider Notes (Signed)
[facility] Emergency Department Provider Note  ____________________________________________  Time seen: Approximately 1:00 PM  I have reviewed the triage vital signs and the nursing notes.   HISTORY  Chief Complaint Chills, Sore Throat, and Cough    HPI Erica Nixon is a 54 y.o. female that presents to the emergency department for evaluation of sore throat, productive cough with phlegm for 3 days.  Patient states that she has had a cough on and off for several weeks.  She saw primary care a month ago and was started on [employer].  She saw ENT a couple of weeks ago and was started on steroids.  Cough improved with the steroids but returned over the weekend.  Cough has kept her up at night.  She has had a couple episodes of diarrhea.  Patient smokes a couple of black and milds per day.  She has asked primary care to provide her with a refill of her albuterol prescription but has not received a refill.  No shortness of breath, chest pain, vomiting, abdominal pain.  Past Medical History:  Diagnosis Date  . Hypertension     Patient Active Problem List   Diagnosis Date Noted  . History of cervical dysplasia 12/26/2018    History reviewed. No pertinent surgical history.  Prior to Admission medications   Medication Sig Start Date End Date Taking? Authorizing Provider  amLODipine (NORVASC) 5 MG tablet Take by mouth.    [provider]  azithromycin (ZITHROMAX Z-PAK) 250 MG tablet Take 2 tablets (500 mg) on  Day 1,  followed by 1 tablet (250 mg) once daily on Days 2 through 5. 01/06/20   Enid Derry, PA-C  cetirizine (ZYRTEC) 10 MG tablet Take by mouth.    [provider]  citalopram (CELEXA) 10 MG tablet Take by mouth.    [provider]  cyclobenzaprine (FLEXERIL) 5 MG tablet Take by mouth.    [provider]  guaiFENesin-codeine 100-10 MG/5ML syrup Take 5 mLs by mouth 3 (three) times daily as needed for cough. 01/06/20    Enid Derry, PA-C  hydroxychloroquine (PLAQUENIL) 200 MG tablet TAKE (1) TABLET BY MOUTH TWICE DAILY. 07/07/14   [provider]  losartan (COZAAR) 25 MG tablet Take by mouth.    [provider]  traZODone (DESYREL) 100 MG tablet Take by mouth.    [provider]    Allergies Patient has no known allergies.  Family History  Problem Relation Age of Onset  . Hypertension Mother   . Hypertension Sister   . Breast cancer Neg Hx     Social History Social History   Tobacco Use  . Smoking status: Current Every Day Smoker  . Smokeless tobacco: Never Used  Vaping Use  . Vaping Use: Never used  Substance Use Topics  . Alcohol use: Never  . Drug use: Never     Review of Systems  Constitutional: No fever/chills ENT: No upper respiratory complaints. Cardiovascular: No chest pain. Respiratory: Positive for cough. No SOB. Gastrointestinal: No abdominal pain.  No nausea, no vomiting.  Positive for diarrhea. Musculoskeletal: Negative for musculoskeletal pain. Skin: Negative for rash, abrasions, lacerations, ecchymosis. Neurological: Negative for headaches, numbness or tingling   ____________________________________________   PHYSICAL EXAM:  VITAL SIGNS: ED Triage Vitals  Enc Vitals Group     BP --      Pulse Rate 01/06/20 1206 (!) 105     Resp 01/06/20 1206 18     Temp 01/06/20 1206 98.5 F (36.9  C)     Temp Source 01/06/20 1206 Oral     SpO2 01/06/20 1206 98 %     Weight 01/06/20 1153 197 lb (89.4 kg)     Height 01/06/20 1153 5\' 4"  (1.626 m)     Head Circumference --      Peak Flow --      Pain Score 01/06/20 1153 0     Pain Loc --      Pain Edu? --      Excl. in GC? --      Constitutional: Alert and oriented. Well appearing and in no acute distress. Eyes: Conjunctivae are normal. PERRL. EOMI. Head: Atraumatic. ENT:      Ears:      Nose: No congestion/rhinnorhea.      Mouth/Throat: Mucous membranes are moist.  Neck: No stridor.    Cardiovascular: Normal rate, regular rhythm.  Good peripheral circulation. Respiratory: Normal respiratory effort without tachypnea or retractions. Lungs CTAB. Good air entry to the bases with no decreased or absent breath sounds. Gastrointestinal: Bowel sounds 4 quadrants. Soft and nontender to palpation. No guarding or rigidity. No palpable masses. No distention.  Musculoskeletal: Full range of motion to all extremities. No gross deformities appreciated. Neurologic:  Normal speech and language. No gross focal neurologic deficits are appreciated.  Skin:  Skin is warm, dry and intact. No rash noted. Psychiatric: Mood and affect are normal. Speech and behavior are normal. Patient exhibits appropriate insight and judgement.   ____________________________________________   LABS (all labs ordered are listed, but only abnormal results are displayed)  Labs Reviewed  GROUP A STREP BY PCR  SARS CORONAVIRUS 2 (TAT 6-24 HRS)   ____________________________________________  EKG   ____________________________________________  RADIOLOGY 01/08/20, personally viewed and evaluated these images (plain radiographs) as part of my medical decision making, as well as reviewing the written report by the radiologist.  DG Chest 1 View  Result Date: 01/06/2020 CLINICAL DATA:  Cough. EXAM: CHEST  1 VIEW COMPARISON:  05/31/2019. FINDINGS: Mediastinum and hilar structures normal. Mild left base subsegmental atelectasis and or scarring. No focal alveolar infiltrate. No pleural effusion or pneumothorax. Heart size normal. Degenerative change thoracic spine. IMPRESSION: Mild left base subsegmental atelectasis and or scarring. Electronically Signed   By: 06/02/2019  Register   On: 01/06/2020 12:27    ____________________________________________    PROCEDURES  Procedure(s) performed:    Procedures    Medications  cefTRIAXone (ROCEPHIN) injection 1 g (1 g Intramuscular Given 01/06/20 1328)   acetaminophen-codeine 120-12 MG/5ML solution 5 mL (5 mLs Oral Given 01/06/20 1328)  lidocaine (PF) (XYLOCAINE) 1 % injection 5 mL (5 mLs Other Given 01/06/20 1446)     ____________________________________________   INITIAL IMPRESSION / ASSESSMENT AND PLAN / ED COURSE  Pertinent labs & imaging results that were available during my care of the patient were reviewed by me and considered in my medical decision making (see chart for details).  Review of the [place] CSRS was performed in accordance of the [facility] prior to dispensing any controlled drugs.     Patient's diagnosis is consistent with URI.  Vital signs and exam are reassuring.  Chest x-ray shows some mild left base atelectasis or scarring without focal infiltrate.  Patient was given a dose of IM ceftriaxone.  Patient will be discharged home with prescriptions for azithromycin and Robitussin. Patient is to follow up with primary care as directed. Patient is given ED precautions to return to the ED for any worsening or new symptoms.  Ether Goebel was evaluated in Emergency Department on 01/06/2020 for the symptoms described in the history of present illness. She was evaluated in the context of the global COVID-19 pandemic, which necessitated consideration that the patient might be at risk for infection with the SARS-CoV-2 virus that causes COVID-19. Institutional protocols and algorithms that pertain to the evaluation of patients at risk for COVID-19 are in a state of rapid change based on information released by regulatory bodies including the CDC and federal and state organizations. These policies and algorithms were followed during the patient's care in the ED.   ____________________________________________  FINAL CLINICAL IMPRESSION(S) / ED DIAGNOSES  Final diagnoses:  Upper respiratory tract infection, unspecified type  Cough      NEW MEDICATIONS STARTED DURING THIS VISIT:  ED Discharge Orders         Ordered    azithromycin  (ZITHROMAX Z-PAK) 250 MG tablet     Discontinue  Reprint     01/06/20 1439    guaiFENesin-codeine 100-10 MG/5ML syrup  3 times daily PRN     Discontinue  Reprint     01/06/20 1447              This chart was dictated using voice recognition software/Dragon. Despite best efforts to proofread, errors can occur which can change the meaning. Any change was purely unintentional.    Enid Derry, PA-C 01/06/20 1546    Jene Every, MD 01/08/20 [phone]

## 2020-01-06 NOTE — ED Notes (Signed)
See triage note  Presents with cough and "feeling bad"  States she has been sick with cough and URI sxs' for a while  Was seen by her PCP  Placed on cough meds  Then went to seen ENT  Was started on steroid taper  States sxs' became worse on Sunday  Unsure of fever but has had chills  Afebrile on arrival

## 2020-01-08 NOTE — ED Notes (Signed)
[facility] and left message yesterday regarding zpack, as there is possible interaction with plaquenil.  Per dr Larinda Buttery, can cancel the z pack and give amoxicillin 1 gram 3 times a day for 5 days.  Called this to [facility].

## 2020-05-26 IMAGING — CR DG CHEST 2V
2 series · 2 of 2 positions shown · non-contrast
Comparison: December 24, 2017

CLINICAL DATA: Cough

EXAM:
CHEST - 2 VIEW

[chest pa]
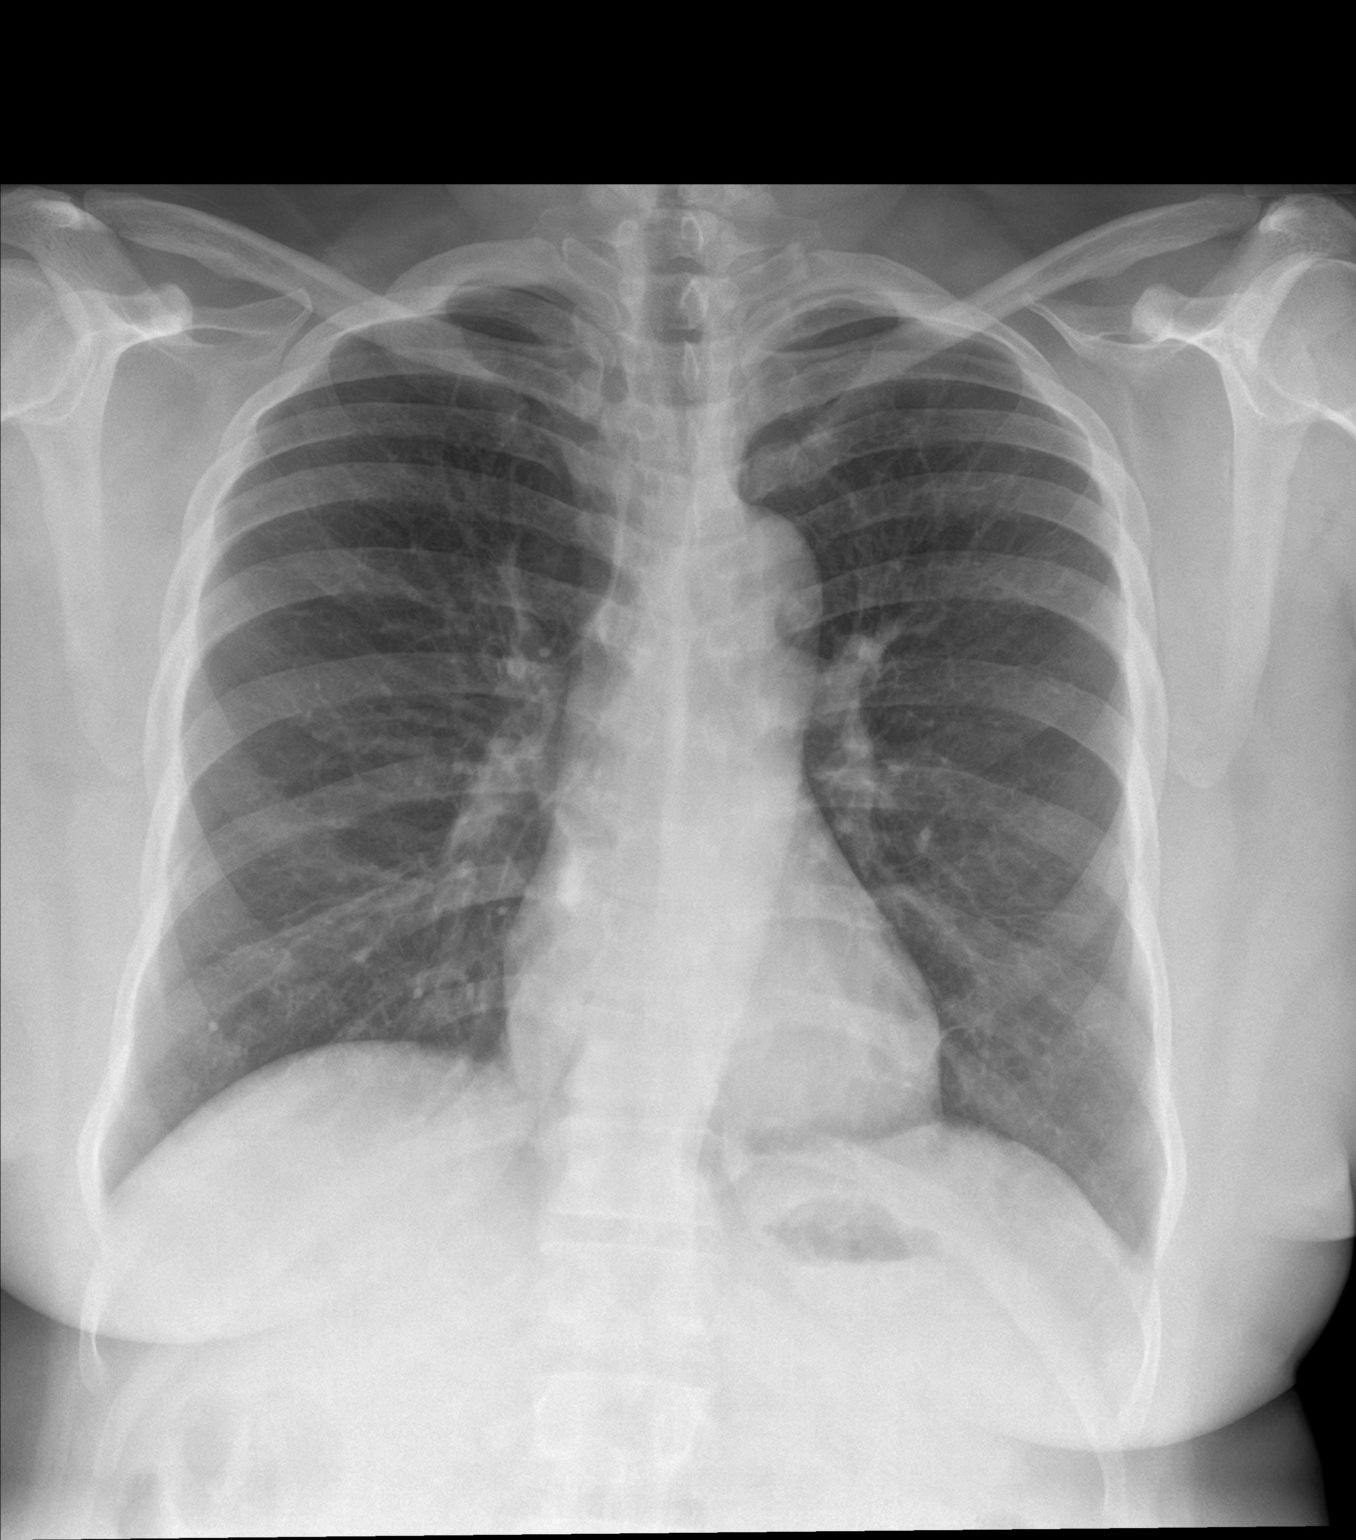

[chest lat]
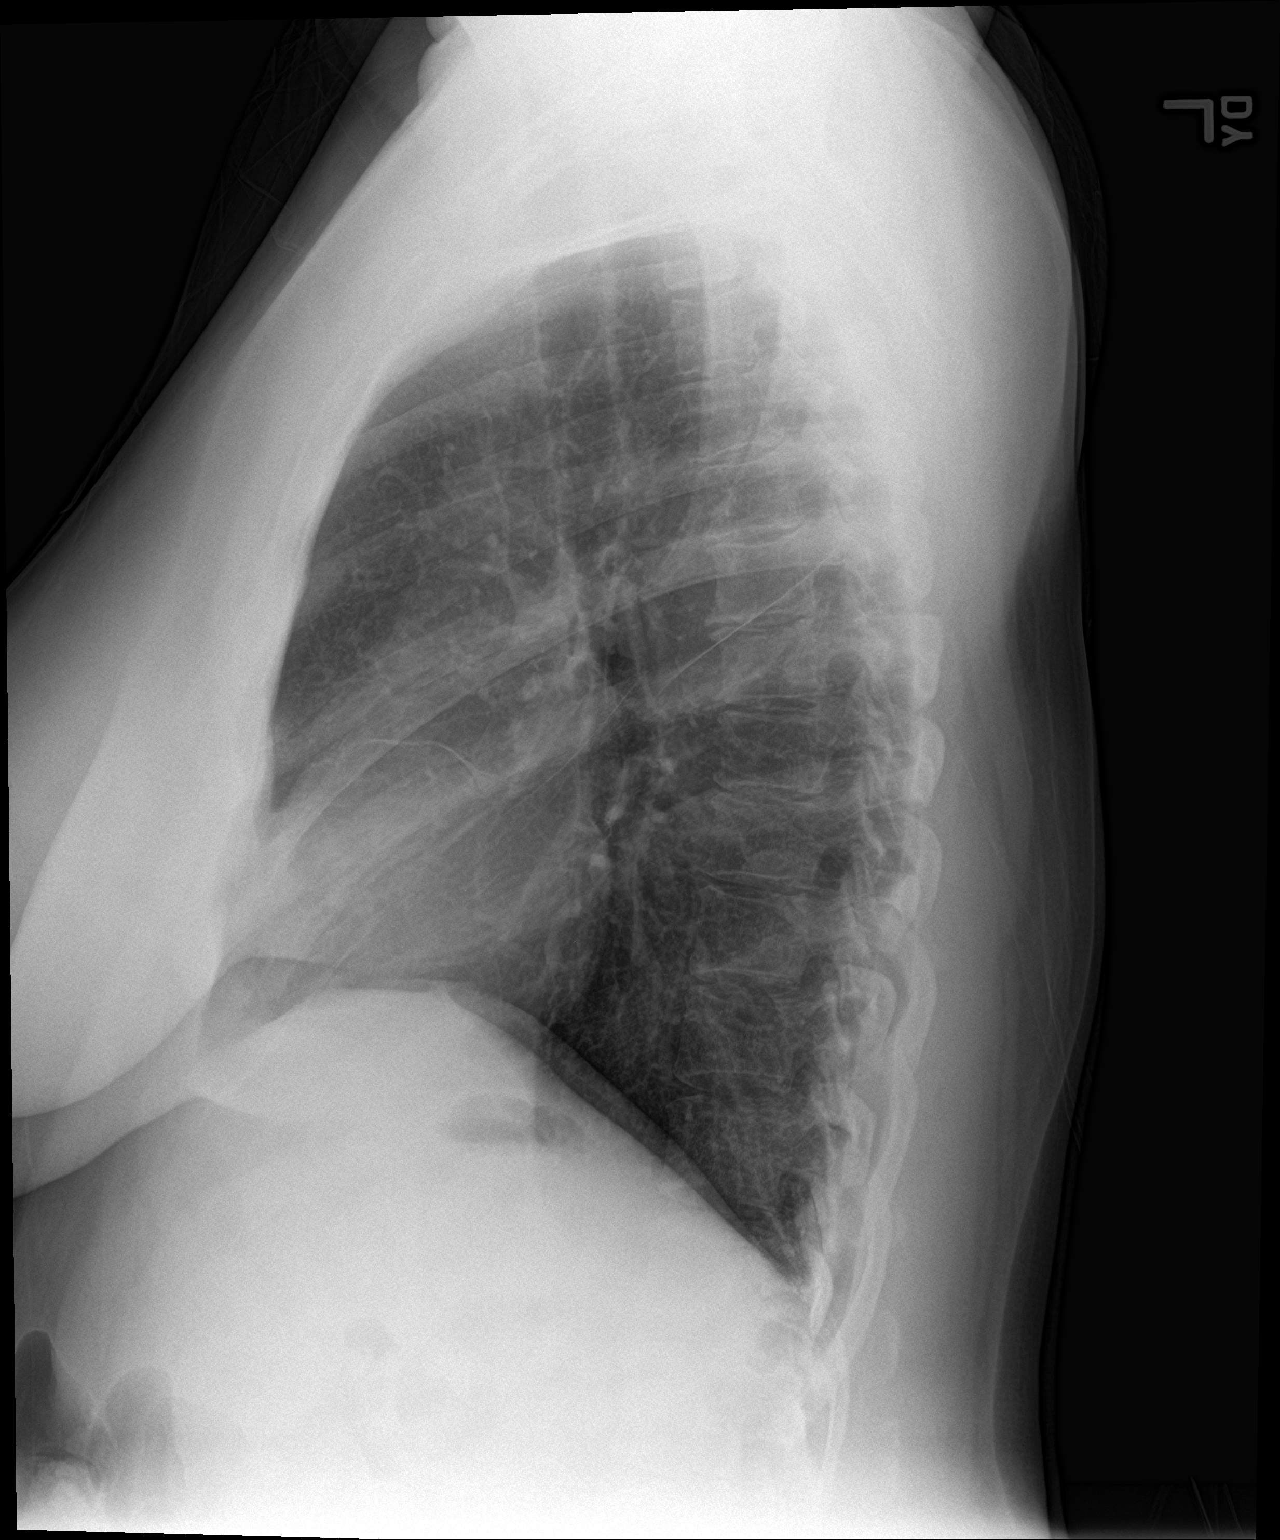

[2 of 2 positions shown; findings below may reference images not displayed]

FINDINGS: The heart size and mediastinal contours are within normal limits.
Both lungs are clear. The visualized skeletal structures are
unremarkable.
IMPRESSION: No active cardiopulmonary disease.

## 2020-06-16 ENCOUNTER — Emergency Department
Admission: EM | Admit: 2020-06-16 | Discharge: 2020-06-16 | Disposition: A | Discharge status: Home / Self Care | Payer: Medicaid Other | Attending: Emergency Medicine | Admitting: Emergency Medicine

## 2020-06-16 ENCOUNTER — Emergency Department: Payer: Medicaid Other

## 2020-06-16 ENCOUNTER — Other Ambulatory Visit: Payer: Self-pay

## 2020-06-16 ENCOUNTER — Encounter: Payer: Self-pay | Admitting: Intensive Care

## 2020-06-16 DIAGNOSIS — Z79899 Other long term (current) drug therapy: Secondary | ICD-10-CM | POA: Insufficient documentation

## 2020-06-16 DIAGNOSIS — R059 Cough, unspecified: Secondary | ICD-10-CM | POA: Diagnosis present

## 2020-06-16 DIAGNOSIS — Z87891 Personal history of nicotine dependence: Secondary | ICD-10-CM | POA: Insufficient documentation

## 2020-06-16 DIAGNOSIS — J069 Acute upper respiratory infection, unspecified: Secondary | ICD-10-CM | POA: Insufficient documentation

## 2020-06-16 DIAGNOSIS — Z20822 Contact with and (suspected) exposure to covid-19: Secondary | ICD-10-CM | POA: Diagnosis not present

## 2020-06-16 DIAGNOSIS — I10 Essential (primary) hypertension: Secondary | ICD-10-CM | POA: Insufficient documentation

## 2020-06-16 HISTORY — DX: Systemic lupus erythematosus, unspecified: M32.9

## 2020-06-16 HISTORY — DX: Unspecified osteoarthritis, unspecified site: M19.90

## 2020-06-16 LAB — BASIC METABOLIC PANEL
Anion gap: 11 (ref 5–15)
BUN: <5 mg/dL — ABNORMAL LOW (ref 6–20)
CO2: 27 mmol/L (ref 22–32)
Calcium: 8.8 mg/dL — ABNORMAL LOW (ref 8.9–10.3)
Chloride: 101 mmol/L (ref 98–111)
Creatinine, Ser: 0.59 mg/dL (ref 0.44–1.00)
GFR, Estimated: >60 mL/min (ref >60)
Glucose, Bld: 103 mg/dL — ABNORMAL HIGH (ref 70–99)
Potassium: 3.3 mmol/L — ABNORMAL LOW (ref 3.5–5.1)
Sodium: 139 mmol/L (ref 135–145)

## 2020-06-16 LAB — CBC
HCT: 40.6 % (ref 36.0–46.0)
Hemoglobin: 13.4 g/dL (ref 12.0–15.0)
MCH: 29.1 pg (ref 26.0–34.0)
MCHC: 33 g/dL (ref 30.0–36.0)
MCV: 88.3 fL (ref 80.0–100.0)
Platelets: 296 10*3/uL (ref 150–400)
RBC: 4.6 MIL/uL (ref 3.87–5.11)
RDW: 13.4 % (ref 11.5–15.5)
WBC: 9 10*3/uL (ref 4.0–10.5)
nRBC: 0 % (ref 0.0–0.2)

## 2020-06-16 LAB — URINALYSIS, COMPLETE (UACMP) WITH MICROSCOPIC
Bilirubin Urine: NEGATIVE
Glucose, UA: NEGATIVE mg/dL
Ketones, ur: NEGATIVE mg/dL
Leukocytes,Ua: NEGATIVE
Nitrite: NEGATIVE
Protein, ur: 30 mg/dL — AB
Specific Gravity, Urine: 1.027 (ref 1.005–1.030)
pH: 5 (ref 5.0–8.0)

## 2020-06-16 LAB — RESP PANEL BY RT-PCR (FLU A&B, COVID) ARPGX2
Influenza A by PCR: NEGATIVE
Influenza B by PCR: NEGATIVE
SARS Coronavirus 2 by RT PCR: NEGATIVE

## 2020-06-16 LAB — TROPONIN I (HIGH SENSITIVITY): Troponin I (High Sensitivity): 5 ng/L (ref <18)

## 2020-06-16 NOTE — ED Triage Notes (Signed)
Pt c/o cough, weakness, congestion, headaches and unable to sleep since Sunday. Patient crying in triage. Denies pain at this time.

## 2020-06-16 NOTE — ED Provider Notes (Signed)
[facility] Emergency Department Provider Note   ____________________________________________   First MD Initiated Contact with Patient 06/16/20 2059     (approximate)  I have reviewed the triage vital signs and the nursing notes.   HISTORY  Chief Complaint Cough and Weakness   HPI Erica Nixon is a 54 y.o. female who reports cough productive of some clear phlegm and postnasal drip with some sinus stuffiness and feeling weak.  Is been going on for about 4 days now.  She has not had a fever.  She is able to taste and smell normally.  She does not really feel short of breath.        Past Medical History:  Diagnosis Date  . Arthritis   . Hypertension   . Lupus [facility])     Patient Active Problem List   Diagnosis Date Noted  . History of cervical dysplasia 12/26/2018    History reviewed. No pertinent surgical history.  Prior to Admission medications   Medication Sig Start Date End Date Taking? Authorizing Provider  amLODipine (NORVASC) 5 MG tablet Take by mouth.    [provider]  azithromycin (ZITHROMAX Z-PAK) 250 MG tablet Take 2 tablets (500 mg) on  Day 1,  followed by 1 tablet (250 mg) once daily on Days 2 through 5. 01/06/20   Enid Derry, PA-C  cetirizine (ZYRTEC) 10 MG tablet Take by mouth.    [provider]  citalopram (CELEXA) 10 MG tablet Take by mouth.    [provider]  cyclobenzaprine (FLEXERIL) 5 MG tablet Take by mouth.    [provider]  guaiFENesin-codeine 100-10 MG/5ML syrup Take 5 mLs by mouth 3 (three) times daily as needed for cough. 01/06/20   Enid Derry, PA-C  hydroxychloroquine (PLAQUENIL) 200 MG tablet TAKE (1) TABLET BY MOUTH TWICE DAILY. 07/07/14   [provider]  losartan (COZAAR) 25 MG tablet Take by mouth.    [provider]  traZODone (DESYREL) 100 MG tablet Take by mouth.    [provider]    Allergies Patient has no known allergies.  Family  History  Problem Relation Age of Onset  . Hypertension Mother   . Hypertension Sister   . Breast cancer Neg Hx     Social History Social History   Tobacco Use  . Smoking status: Former Smoker    Types: Cigars  . Smokeless tobacco: Never Used  Vaping Use  . Vaping Use: Never used  Substance Use Topics  . Alcohol use: Yes  . Drug use: Yes    Types: Marijuana    Review of Systems  Constitutional: No fever/chills Eyes: No visual changes. ENT: No sore throat. Cardiovascular: Denies chest pain. Respiratory: Denies shortness of breath. Gastrointestinal: No abdominal pain.  No nausea, no vomiting.  No diarrhea.  No constipation. Genitourinary: Negative for dysuria. Musculoskeletal: Negative for back pain. Skin: Negative for rash. Neurological: Negative for focal weakness :    ____________________________________________   PHYSICAL EXAM:  VITAL SIGNS: ED Triage Vitals  Enc Vitals Group     BP 06/16/20 1518 111/81     Pulse Rate 06/16/20 1518 91     Resp 06/16/20 1518 18     Temp 06/16/20 1518 98.9 F (37.2 C)     Temp Source 06/16/20 1518 Oral     SpO2 06/16/20 1518 97 %     Weight 06/16/20 1515 213 lb (96.6 kg)     Height 06/16/20 1515 5\' 4"  (1.626 m)  Head Circumference --      Peak Flow --      Pain Score 06/16/20 1515 0     Pain Loc --      Pain Edu? --      Excl. in [place]? --     Constitutional: Alert and oriented. Well appearing and in no acute distress. Eyes: Conjunctivae are normal.  Head: Atraumatic. Nose: No congestion/rhinnorhea. Mouth/Throat: Mucous membranes are moist.  Oropharynx non-erythematous. Neck: No stridor.   Cardiovascular: Normal rate, regular rhythm. Grossly normal heart sounds.  Good peripheral circulation. Respiratory: Normal respiratory effort.  No retractions. Lungs CTAB. Gastrointestinal: Soft and nontender. No distention. No abdominal bruits. No CVA tenderness. Musculoskeletal: No lower extremity tenderness nor edema.    Neurologic:  Normal speech and language. No gross focal neurologic deficits are appreciated.  Skin:  Skin is warm, dry and intact. No rash noted.   ____________________________________________   LABS (all labs ordered are listed, but only abnormal results are displayed)  Labs Reviewed  BASIC METABOLIC PANEL - Abnormal; Notable for the following components:      Result Value   Potassium 3.3 (*)    Glucose, Bld 103 (*)    BUN <5 (*)    Calcium 8.8 (*)    All other components within normal limits  URINALYSIS, COMPLETE (UACMP) WITH MICROSCOPIC - Abnormal; Notable for the following components:   Color, Urine AMBER (*)    APPearance CLOUDY (*)    Hgb urine dipstick LARGE (*)    Protein, ur 30 (*)    Bacteria, UA RARE (*)    All other components within normal limits  RESP PANEL BY RT-PCR (FLU A&B, COVID) ARPGX2  CBC  TROPONIN I (HIGH SENSITIVITY)   ____________________________________________  EKG EKG read interpreted by me shows normal sinus rhythm rate of 90 normal axis some decreased R wave progression but no acute ST-T wave changes are seen ____________________________________________  RADIOLOGY Jill Poling, personally viewed and evaluated these images (plain radiographs) as part of my medical decision making, as well as reviewing the written report by the radiologist.  ED MD interpretation: Chest x-ray read by radiology reviewed by me shows no active disease.  Official radiology report(s): DG Chest 2 View  Result Date: 06/16/2020 CLINICAL DATA:  Cough. EXAM: CHEST - 2 VIEW COMPARISON:  January 06, 2020. FINDINGS: The heart size and mediastinal contours are within normal limits. No consolidation. No visible pleural effusions or pneumothorax. No acute osseous abnormality. IMPRESSION: No active cardiopulmonary disease. Electronically Signed   By: Feliberto Harts MD   On: 06/16/2020 20:14    ____________________________________________   PROCEDURES  Procedure(s)  performed (including Critical Care):  Procedures   ____________________________________________   INITIAL IMPRESSION / ASSESSMENT AND PLAN / ED COURSE Patient does not have any pleuritic chest pain.  She has some clear mucus that she is coughing up.  Her coronavirus and flu tests are negative.  She is not tachycardic and has nothing on her chest x-ray I am not at this point considering pulmonary embolus or pneumonia.  I think she just has an upper respiratory infection.  I will have her come back if she is worse use an over-the-counter cough medicine and use her albuterol inhaler.              ____________________________________________   FINAL CLINICAL IMPRESSION(S) / ED DIAGNOSES  Final diagnoses:  Cough  Upper respiratory tract infection, unspecified type     ED Discharge Orders    None      *  Please note:  Latrecia Capito was evaluated in Emergency Department on 06/16/2020 for the symptoms described in the history of present illness. She was evaluated in the context of the global COVID-19 pandemic, which necessitated consideration that the patient might be at risk for infection with the SARS-CoV-2 virus that causes COVID-19. Institutional protocols and algorithms that pertain to the evaluation of patients at risk for COVID-19 are in a state of rapid change based on information released by regulatory bodies including the CDC and federal and state organizations. These policies and algorithms were followed during the patient's care in the ED.  Some ED evaluations and interventions may be delayed as a result of limited staffing during and the pandemic.*   Note:  This document was prepared using Dragon voice recognition software and may include unintentional dictation errors.    Arnaldo Natal, MD 06/16/20 2156

## 2020-06-16 NOTE — Discharge Instructions (Signed)
Your coronavirus test and your flu test were negative.  The blood work looks okay.  I am going to ask you to use your inhaler 2 puffs 4 times a day and before bed.  Get some Robitussin cough syrup over-the-counter or asked the pharmacist what they recommend.  You can sleep with a few pillows under you for a day or 2 to see if that helps with the postnasal drip.  Please return for fever feeling sicker or getting more short of breath.  Otherwise it looks like you have a viral but not coronavirus respiratory tract infection and you should be getting better in 4 to 5 days.

## 2022-06-30 ENCOUNTER — Other Ambulatory Visit: Payer: Self-pay | Admitting: Family Medicine

## 2022-06-30 ENCOUNTER — Other Ambulatory Visit: Payer: Self-pay

## 2022-06-30 DIAGNOSIS — Z1231 Encounter for screening mammogram for malignant neoplasm of breast: Secondary | ICD-10-CM

## 2022-07-21 ENCOUNTER — Encounter: Payer: Self-pay | Admitting: Family Medicine

## 2022-07-25 ENCOUNTER — Other Ambulatory Visit: Payer: Self-pay | Admitting: Family Medicine

## 2022-07-25 DIAGNOSIS — N950 Postmenopausal bleeding: Secondary | ICD-10-CM

## 2022-08-03 ENCOUNTER — Ambulatory Visit
Admission: RE | Admit: 2022-08-03 | Discharge: 2022-08-03 | Disposition: A | Discharge status: Home / Self Care | Payer: Medicaid Other | Source: Ambulatory Visit | Attending: Family Medicine | Admitting: Family Medicine

## 2022-08-03 DIAGNOSIS — N950 Postmenopausal bleeding: Secondary | ICD-10-CM | POA: Insufficient documentation

## 2022-08-28 ENCOUNTER — Encounter: Payer: Medicaid Other | Admitting: Obstetrics & Gynecology

## 2022-09-13 ENCOUNTER — Encounter: Payer: Medicaid Other | Admitting: Obstetrics & Gynecology

## 2022-09-15 ENCOUNTER — Telehealth: Payer: Self-pay | Admitting: Obstetrics & Gynecology

## 2022-09-15 NOTE — Telephone Encounter (Signed)
Reached out to pt to reschedule gyn appt that was scheduled with Dr. Hulan Fray on 09/13/2022 at 2:35.  Left message for pt to call back.

## 2022-09-18 ENCOUNTER — Encounter: Payer: Self-pay | Admitting: Obstetrics & Gynecology

## 2022-09-18 NOTE — Telephone Encounter (Signed)
Reached out to pt to reschedule gyn referral appt that was scheduled with Dr.Dove on 09/13/2022 at 2:35.  Left message for pt to call back.  Will send a MyChart letter.

## 2022-09-19 ENCOUNTER — Encounter: Payer: Self-pay | Admitting: Obstetrics & Gynecology

## 2022-10-08 ENCOUNTER — Other Ambulatory Visit: Payer: Self-pay

## 2022-10-08 ENCOUNTER — Emergency Department
Admission: EM | Admit: 2022-10-08 | Discharge: 2022-10-08 | Disposition: A | Discharge status: Home / Self Care | Payer: Medicaid Other | Attending: Emergency Medicine | Admitting: Emergency Medicine

## 2022-10-08 ENCOUNTER — Emergency Department: Payer: Medicaid Other

## 2022-10-08 DIAGNOSIS — M549 Dorsalgia, unspecified: Secondary | ICD-10-CM

## 2022-10-08 DIAGNOSIS — M546 Pain in thoracic spine: Secondary | ICD-10-CM | POA: Insufficient documentation

## 2022-10-08 DIAGNOSIS — I10 Essential (primary) hypertension: Secondary | ICD-10-CM | POA: Diagnosis not present

## 2022-10-08 LAB — URINALYSIS, ROUTINE W REFLEX MICROSCOPIC
Bilirubin Urine: NEGATIVE
Glucose, UA: NEGATIVE mg/dL
Hgb urine dipstick: NEGATIVE
Ketones, ur: NEGATIVE mg/dL
Leukocytes,Ua: NEGATIVE
Nitrite: NEGATIVE
Protein, ur: NEGATIVE mg/dL
Specific Gravity, Urine: 1.028 (ref 1.005–1.030)
pH: 5 (ref 5.0–8.0)

## 2022-10-08 NOTE — ED Triage Notes (Signed)
Pt to ED POV for R side back pain. Denies pain in side or front. Denies pain shooting into leg. Pain is mostly to mid upper R back. Hard to sleep. Onset was 1 week ago. Had mechanical fall about 2 weeks ago, unsure if this was related. Was not seen at that time. Has been taking prescribed pain med ("like tylenol but stronger", unsure name) without relief. No meds taken today.

## 2022-10-08 NOTE — ED Provider Notes (Signed)
[facility] Provider Note  Patient Contact: 3:27 PM (approximate)   History   Back Pain   HPI  Erica Nixon is a 57 y.o. female with a history of hypertension lupus, presents to the emergency department with right-sided upper back pain for 1 week.  Patient states that she did have a mechanical fall 2 weeks ago but no recent falls or traumas.  She denies dysuria, hematuria or increased urinary frequency.  No associated shortness of breath, chest tightness or chest pain.  Upper back pain is reproducible to palpation.      Physical Exam   Triage Vital Signs: ED Triage Vitals  Enc Vitals Group     BP 10/08/22 1437 100/69     Pulse Rate 10/08/22 1437 92     Resp 10/08/22 1437 17     Temp 10/08/22 1437 98.2 F (36.8 C)     Temp Source 10/08/22 1437 Oral     SpO2 10/08/22 1437 94 %     Weight 10/08/22 1435 215 lb (97.5 kg)     Height 10/08/22 1435 5\' 4"  (1.626 m)     Head Circumference --      Peak Flow --      Pain Score 10/08/22 1434 10     Pain Loc --      Pain Edu? --      Excl. in [place]? --     Most recent vital signs: Vitals:   10/08/22 1437  BP: 100/69  Pulse: 92  Resp: 17  Temp: 98.2 F (36.8 C)  SpO2: 94%     General: Alert and in no acute distress. Eyes:  PERRL. EOMI. Head: No acute traumatic findings ENT:      Nose: No congestion/rhinnorhea.      Mouth/Throat: Mucous membranes are moist. Neck: No stridor. No cervical spine tenderness to palpation. Cardiovascular:  Good peripheral perfusion Respiratory: Normal respiratory effort without tachypnea or retractions. Lungs CTAB. Good air entry to the bases with no decreased or absent breath sounds. Gastrointestinal: Bowel sounds 4 quadrants. Soft and nontender to palpation. No guarding or rigidity. No palpable masses. No distention. No CVA tenderness. Musculoskeletal: Full range of motion to all extremities.  Patient has tenderness over the right latissimus dorsi. Neurologic:  No  gross focal neurologic deficits are appreciated.  Skin:   No rash noted Other:   ED Results / Procedures / Treatments   Labs (all labs ordered are listed, but only abnormal results are displayed) Labs Reviewed  URINALYSIS, ROUTINE W REFLEX MICROSCOPIC - Abnormal; Notable for the following components:      Result Value   Color, Urine YELLOW (*)    APPearance CLOUDY (*)    All other components within normal limits       RADIOLOGY  I personally viewed and evaluated these images as part of my medical decision making, as well as reviewing the written report by the radiologist.  ED Provider Interpretation: No acute abnormality on x-ray of thoracic spine.   PROCEDURES:  Critical Care performed: No  Procedures   MEDICATIONS ORDERED IN ED: Medications - No data to display   IMPRESSION / MDM / [place] / ED COURSE  I reviewed the triage vital signs and the nursing notes.                              Assessment and plan: Upper back pain:  57 year old female presents to the  emergency department with upper back pain for the past week.  Vital signs are reassuring at triage.  On exam, patient had some latissimus reproducible tenderness to palpation.  Patient denied shortness of breath, cough or chest tightness.  Urinalysis shows no signs of UTI or hematuria.  Suspect muscle spasm.  Patient's PCP has prescribed Flexeril and I recommended continuing medication.  Patient has a follow-up appointment with her PCP next week and I recommended keeping appointment.  Return precautions were given to return with new or worsening symptoms.  All patient questions were answered.      FINAL CLINICAL IMPRESSION(S) / ED DIAGNOSES   Final diagnoses:  Upper back pain     Rx / DC Orders   ED Discharge Orders     None        Note:  This document was prepared using Dragon voice recognition software and may include unintentional dictation errors.   Vallarie Mare [place],  PA-C 10/08/22 1714    Blake Divine, MD 10/10/22 0730

## 2022-10-08 NOTE — Discharge Instructions (Signed)
Continue taking muscle relaxer as prescribed by your PCP.

## 2024-01-07 ENCOUNTER — Other Ambulatory Visit: Payer: Self-pay | Admitting: Physician Assistant

## 2024-01-07 DIAGNOSIS — Z1231 Encounter for screening mammogram for malignant neoplasm of breast: Secondary | ICD-10-CM
